# Patient Record
Sex: Female | Born: 1955 | Race: White | Hispanic: No | Marital: Married | Smoking: Never smoker
Health system: Southern US, Community
[De-identification: ages and names within clinical notes are randomized; demographics above are authoritative.]

## PROBLEM LIST (undated history)

## (undated) DIAGNOSIS — Z460 Encounter for fitting and adjustment of spectacles and contact lenses: Secondary | ICD-10-CM

## (undated) DIAGNOSIS — S83249A Other tear of medial meniscus, current injury, unspecified knee, initial encounter: Secondary | ICD-10-CM

## (undated) DIAGNOSIS — M65311 Trigger thumb, right thumb: Secondary | ICD-10-CM

## (undated) DIAGNOSIS — F419 Anxiety disorder, unspecified: Secondary | ICD-10-CM

## (undated) DIAGNOSIS — D219 Benign neoplasm of connective and other soft tissue, unspecified: Secondary | ICD-10-CM

## (undated) DIAGNOSIS — F32A Depression, unspecified: Secondary | ICD-10-CM

## (undated) DIAGNOSIS — R519 Headache, unspecified: Secondary | ICD-10-CM

## (undated) DIAGNOSIS — T4145XA Adverse effect of unspecified anesthetic, initial encounter: Secondary | ICD-10-CM

## (undated) DIAGNOSIS — F329 Major depressive disorder, single episode, unspecified: Secondary | ICD-10-CM

## (undated) DIAGNOSIS — R51 Headache: Secondary | ICD-10-CM

## (undated) DIAGNOSIS — T8859XA Other complications of anesthesia, initial encounter: Secondary | ICD-10-CM

## (undated) DIAGNOSIS — G5601 Carpal tunnel syndrome, right upper limb: Secondary | ICD-10-CM

## (undated) DIAGNOSIS — N809 Endometriosis, unspecified: Secondary | ICD-10-CM

## (undated) DIAGNOSIS — N390 Urinary tract infection, site not specified: Secondary | ICD-10-CM

## (undated) HISTORY — DX: Trigger thumb, right thumb: M65.311

## (undated) HISTORY — DX: Carpal tunnel syndrome, right upper limb: G56.01

## (undated) HISTORY — PX: OTHER SURGICAL HISTORY: SHX169

## (undated) HISTORY — DX: Urinary tract infection, site not specified: N39.0

## (undated) HISTORY — PX: SHOULDER SURGERY: SHX246

## (undated) HISTORY — PX: CARPAL TUNNEL RELEASE: SHX101

## (undated) HISTORY — DX: Benign neoplasm of connective and other soft tissue, unspecified: D21.9

## (undated) HISTORY — DX: Endometriosis, unspecified: N80.9

---

## 1991-01-25 HISTORY — PX: DILATION AND CURETTAGE OF UTERUS: SHX78

## 1991-01-25 HISTORY — PX: HYSTEROSCOPY: SHX211

## 1993-02-24 HISTORY — PX: APPENDECTOMY: SHX54

## 1993-06-25 HISTORY — PX: ABDOMINAL HYSTERECTOMY: SHX81

## 1998-07-30 ENCOUNTER — Other Ambulatory Visit: Admission: RE | Admit: 1998-07-30 | Discharge: 1998-07-30 | Payer: Self-pay | Admitting: Obstetrics and Gynecology

## 1999-07-02 ENCOUNTER — Ambulatory Visit (HOSPITAL_COMMUNITY): Admission: RE | Admit: 1999-07-02 | Discharge: 1999-07-02 | Payer: Self-pay | Admitting: Family Medicine

## 1999-07-02 ENCOUNTER — Encounter: Payer: Self-pay | Admitting: Family Medicine

## 1999-07-23 ENCOUNTER — Encounter: Payer: Self-pay | Admitting: Family Medicine

## 1999-07-23 ENCOUNTER — Encounter: Admission: RE | Admit: 1999-07-23 | Discharge: 1999-07-23 | Payer: Self-pay | Admitting: Family Medicine

## 1999-10-21 ENCOUNTER — Ambulatory Visit (HOSPITAL_COMMUNITY): Admission: RE | Admit: 1999-10-21 | Discharge: 1999-10-21 | Payer: Self-pay | Admitting: Neurosurgery

## 1999-10-21 ENCOUNTER — Encounter: Payer: Self-pay | Admitting: Neurosurgery

## 2000-10-15 ENCOUNTER — Ambulatory Visit (HOSPITAL_BASED_OUTPATIENT_CLINIC_OR_DEPARTMENT_OTHER): Admission: RE | Admit: 2000-10-15 | Discharge: 2000-10-15 | Payer: Self-pay | Admitting: Orthopedic Surgery

## 2000-10-15 HISTORY — PX: SHOULDER ARTHROSCOPY: SHX128

## 2002-02-04 ENCOUNTER — Encounter: Admission: RE | Admit: 2002-02-04 | Discharge: 2002-02-04 | Payer: Self-pay | Admitting: Internal Medicine

## 2002-02-04 ENCOUNTER — Encounter: Payer: Self-pay | Admitting: Obstetrics and Gynecology

## 2004-07-27 ENCOUNTER — Encounter: Admission: RE | Admit: 2004-07-27 | Discharge: 2004-07-27 | Payer: Self-pay | Admitting: Orthopedic Surgery

## 2005-06-25 HISTORY — PX: COLONOSCOPY W/ BIOPSIES: SHX1374

## 2007-03-05 ENCOUNTER — Encounter: Admission: RE | Admit: 2007-03-05 | Discharge: 2007-03-05 | Payer: Self-pay | Admitting: Obstetrics and Gynecology

## 2010-03-28 ENCOUNTER — Emergency Department (HOSPITAL_COMMUNITY)
Admission: EM | Admit: 2010-03-28 | Discharge: 2010-03-28 | Disposition: A | Discharge status: Home / Self Care | Payer: BC Managed Care – PPO | Attending: Emergency Medicine | Admitting: Emergency Medicine

## 2010-03-28 DIAGNOSIS — Z8739 Personal history of other diseases of the musculoskeletal system and connective tissue: Secondary | ICD-10-CM | POA: Insufficient documentation

## 2010-03-28 DIAGNOSIS — R079 Chest pain, unspecified: Secondary | ICD-10-CM | POA: Insufficient documentation

## 2010-03-28 DIAGNOSIS — R071 Chest pain on breathing: Secondary | ICD-10-CM | POA: Insufficient documentation

## 2010-05-27 ENCOUNTER — Other Ambulatory Visit: Payer: Self-pay | Admitting: Obstetrics and Gynecology

## 2010-05-27 DIAGNOSIS — Z1231 Encounter for screening mammogram for malignant neoplasm of breast: Secondary | ICD-10-CM

## 2010-06-10 ENCOUNTER — Ambulatory Visit: Payer: BC Managed Care – PPO

## 2010-06-11 ENCOUNTER — Ambulatory Visit
Admission: RE | Admit: 2010-06-11 | Discharge: 2010-06-11 | Disposition: A | Discharge status: Home / Self Care | Payer: BLUE CROSS/BLUE SHIELD | Source: Ambulatory Visit | Attending: Obstetrics and Gynecology | Admitting: Obstetrics and Gynecology

## 2010-06-11 DIAGNOSIS — Z1231 Encounter for screening mammogram for malignant neoplasm of breast: Secondary | ICD-10-CM

## 2010-07-12 NOTE — Op Note (Signed)
[facility]  Patient:    Megan Kidd, Megan Kidd Visit Number: [id number] MRN: [id number]          Service Type: DSU Location: [facility] Attending Physician:  Colbert Ewing Proc. Date: 10/15/00 Adm. 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  PROCEDURES: 1. Right shoulder examination and manipulation under anesthesia to obtain    motion. 2. Arthroscopy with lysis, debridement of intra-articular and subacromial    adhesions. 3. Acromioplasty with distal clavicle excision.  SURGEON:  Loreta Ave, M.D.  ASSISTANT:  Arlys John D. Petrarca, P.A.-C.  ANESTHESIA:  General.  ESTIMATED BLOOD LOSS:  Minimal.  SPECIMENS:  None.  CULTURES:  None.  COMPLICATIONS:  None.  DRESSING:  Soft compressive with sling.  DESCRIPTION OF PROCEDURE:  Patient brought to the operating room, placed on the operating table in supine position.  After adequate anesthesia had been obtained, right shoulder examined.  Motion at the start revealed 60 degrees of abduction, 90 degrees forward flexion, 60 degrees external rotation, internal rotation to 0 with the arm abducted.  The arm was sterilely manipulated with break-up of intra-articular and subacromial adhesions.  No adverse occurrence. Full motion achieved, maintaining stable shoulder.  Placed in a beach chair position on the shoulder positioner, prepped and draped in the usual sterile fashion.  Three standard arthroscopic portals, anterior, posterior, lateral. Shoulder entered with a blunt obturator, distended, and inspected. Intra-articular adhesions debrided along with synovitis.  Articular cartilage, rotator cuff, biceps tendon, biceps anchor,  labrum, and capsular ligamentous structures all otherwise normal.  Cannula redirected subacromially.  Thickened bursa with adhesions there as well all debrided.  Impingement with a type 2 acromion anteriorly, converted to a type 1 acromion with shaver and high-speed bur, releasing CA ligament.  Grade 3 chondromalacia AC joint from degenerative changes there.  Lateral centimeter sharply resected with shaver and high-speed bur.  Adequacy of decompression, distal clavicle excision, and lysis of adhesions confirmed viewing from all portals.  Instruments and fluid removed. Portals, shoulder, and bursa injected with Marcaine.  Portals closed with 4-0 nylon.  A sterile compressive dressing applied.  Anesthesia reversed, brought to recovery room.  Tolerated the surgery well with no complications. Attending Physician:  Colbert Ewing DD:  10/15/00 TD:  10/16/00 Job: [phone]

## 2011-04-25 HISTORY — PX: BUNIONECTOMY: SHX129

## 2011-06-26 ENCOUNTER — Other Ambulatory Visit: Payer: Self-pay | Admitting: Obstetrics and Gynecology

## 2011-06-30 ENCOUNTER — Other Ambulatory Visit: Payer: Self-pay | Admitting: Obstetrics and Gynecology

## 2011-06-30 DIAGNOSIS — M8580 Other specified disorders of bone density and structure, unspecified site: Secondary | ICD-10-CM

## 2011-06-30 DIAGNOSIS — Z1231 Encounter for screening mammogram for malignant neoplasm of breast: Secondary | ICD-10-CM

## 2011-07-11 ENCOUNTER — Ambulatory Visit: Payer: BLUE CROSS/BLUE SHIELD

## 2011-07-11 ENCOUNTER — Ambulatory Visit
Admission: RE | Admit: 2011-07-11 | Discharge: 2011-07-11 | Disposition: A | Discharge status: Home / Self Care | Payer: BC Managed Care – PPO | Source: Ambulatory Visit | Attending: Obstetrics and Gynecology | Admitting: Obstetrics and Gynecology

## 2011-07-11 DIAGNOSIS — M8580 Other specified disorders of bone density and structure, unspecified site: Secondary | ICD-10-CM

## 2011-07-11 DIAGNOSIS — Z1231 Encounter for screening mammogram for malignant neoplasm of breast: Secondary | ICD-10-CM

## 2012-08-23 ENCOUNTER — Encounter: Payer: Self-pay | Admitting: Obstetrics & Gynecology

## 2012-08-23 ENCOUNTER — Encounter: Payer: Self-pay | Admitting: Nurse Practitioner

## 2012-08-23 ENCOUNTER — Ambulatory Visit (INDEPENDENT_AMBULATORY_CARE_PROVIDER_SITE_OTHER): Payer: BC Managed Care – PPO | Admitting: Obstetrics & Gynecology

## 2012-08-23 VITALS — BP 128/80 | HR 60 | Temp 99.3°F | Resp 12 | Ht 62.25 in | Wt 145.6 lb

## 2012-08-23 DIAGNOSIS — N390 Urinary tract infection, site not specified: Secondary | ICD-10-CM

## 2012-08-23 LAB — POCT URINALYSIS DIPSTICK
Bilirubin, UA: NEGATIVE
Ketones, UA: NEGATIVE
Leukocytes, UA: NEGATIVE
pH, UA: 5

## 2012-08-23 MED ORDER — NITROFURANTOIN MONOHYD MACRO 100 MG PO CAPS: 100.0000 mg | ORAL_CAPSULE | Freq: Two times a day (BID) | ORAL | Status: DC

## 2012-08-23 NOTE — Progress Notes (Signed)
Subjective:     Patient ID: Megan Kidd, female   DOB: 05-07-1955, 57 y.o.   MRN: [id number]  Urinary Tract Infection  Associated symptoms include flank pain, hematuria and urgency.   57 year old MWF here with symptoms of bladder pressure and back pain since early Saturday morning.  Took three days of Levoquin--an antibiotic she had at home.  Reports this was Rx given to patient by Shirlyn Goltz.  Has noticed symptoms are mildly better but not gone.  Having some back pain.  Reports this started after intercourse on Thursday.  Going to [employer].  Going out of the July 17th--August 3rd.    Review of Systems  Constitutional:       Chills on Saturday morning  Genitourinary: Positive for urgency, hematuria, flank pain and dyspareunia.  All other systems reviewed and are negative.       Objective:   Physical Exam  Constitutional: She is oriented to person, place, and time. She appears well-developed and well-nourished.  Abdominal: Soft. Bowel sounds are normal. Distention: no suprapubic tenderness.  Genitourinary: Vagina normal and uterus normal.  Neurological: She is alert and oriented to person, place, and time.  Skin: Skin is warm and dry.  Psychiatric: She has a normal mood and affect.       Assessment:     UTI?, possible Levaquin resistance  H/O recurrent UTI's     Plan:     macrobid 100mg  bid x 7 days Urine culture

## 2012-08-23 NOTE — Patient Instructions (Signed)
Urinary Tract Infection  Urinary tract infections (UTIs) can develop anywhere along your urinary tract. Your urinary tract is your body's drainage system for removing wastes and extra water. Your urinary tract includes two kidneys, two ureters, a bladder, and a urethra. Your kidneys are a pair of bean-shaped organs. Each kidney is about the size of your fist. They are located below your ribs, one on each side of your spine.  CAUSES  Infections are caused by microbes, which are microscopic organisms, including fungi, viruses, and bacteria. These organisms are so small that they can only be seen through a microscope. Bacteria are the microbes that most commonly cause UTIs.  SYMPTOMS   Symptoms of UTIs may vary by age and gender of the patient and by the location of the infection. Symptoms in young women typically include a frequent and intense urge to urinate and a painful, burning feeling in the bladder or urethra during urination. Older women and men are more likely to be tired, shaky, and weak and have muscle aches and abdominal pain. A fever may mean the infection is in your kidneys. Other symptoms of a kidney infection include pain in your back or sides below the ribs, nausea, and vomiting.  DIAGNOSIS  To diagnose a UTI, your caregiver will ask you about your symptoms. Your caregiver also will ask to provide a urine sample. The urine sample will be tested for bacteria and white blood cells. White blood cells are made by your body to help fight infection.  TREATMENT   Typically, UTIs can be treated with medication. Because most UTIs are caused by a bacterial infection, they usually can be treated with the use of antibiotics. The choice of antibiotic and length of treatment depend on your symptoms and the type of bacteria causing your infection.  HOME CARE INSTRUCTIONS   If you were prescribed antibiotics, take them exactly as your caregiver instructs you. Finish the medication even if you feel better after you  have only taken some of the medication.   Drink enough water and fluids to keep your urine clear or pale yellow.   Avoid caffeine, tea, and carbonated beverages. They tend to irritate your bladder.   Empty your bladder often. Avoid holding urine for long periods of time.   Empty your bladder before and after sexual intercourse.   After a bowel movement, women should cleanse from front to back. Use each tissue only once.  SEEK MEDICAL CARE IF:    You have back pain.   You develop a fever.   Your symptoms do not begin to resolve within 3 days.  SEEK IMMEDIATE MEDICAL CARE IF:    You have severe back pain or lower abdominal pain.   You develop chills.   You have nausea or vomiting.   You have continued burning or discomfort with urination.  MAKE SURE YOU:    Understand these instructions.   Will watch your condition.   Will get help right away if you are not doing well or get worse.  Document Released: 11/20/2004 Document Revised: 08/12/2011 Document Reviewed: 03/21/2011  ExitCare Patient Information 2014 [employer].

## 2012-08-24 LAB — URINE CULTURE: Organism ID, Bacteria: NO GROWTH

## 2012-08-25 ENCOUNTER — Telehealth: Payer: Self-pay

## 2012-08-25 NOTE — Telephone Encounter (Signed)
Message copied by Elisha Headland on Wed Aug 25, 2012 11:18 AM ------      Message from: Jerene Bears      Created: Tue Aug 24, 2012  9:40 PM       Please call patient and inform culture is negative.  How are symptoms? ------

## 2012-08-25 NOTE — Telephone Encounter (Signed)
7/2 [id number]//[id number] 

## 2012-08-25 NOTE — Telephone Encounter (Signed)
Pt is returning Kelly's call

## 2012-08-25 NOTE — Telephone Encounter (Signed)
Patient notified of results.

## 2012-08-30 ENCOUNTER — Telehealth: Payer: Self-pay | Admitting: Obstetrics & Gynecology

## 2012-08-30 NOTE — Telephone Encounter (Signed)
Patient is still out of town and will be back on Thursday. Appointment given for Megan Kidd , FNP, July 10th @ 11:15am. Patient states she took all of macrobid with still some allergic reaction to but took for 7 days. States she still has pressure with urination and burning sensation.still with back pain. States she is going out of country on July 17th and needs to be well. Patient asked if you would call in antibiotic to where she is now till she can see you on Thursday. See note on June 30th  In EPIC. Reaction to Levoquini also.

## 2012-08-30 NOTE — Telephone Encounter (Signed)
Patient is not better after taking medication given by Dr.Miller. Patient would like to talk with a nurse.

## 2012-08-31 NOTE — Telephone Encounter (Signed)
She needs a urology appointment as well.

## 2012-08-31 NOTE — Telephone Encounter (Signed)
Follow up call to patient who complains of pain at onset of urine flow and bladder pressure.  She reports on-going history of UTI for which she had RX of Levaquin left over and self treated before seeing Dr Hyacinth Meeker 08-23-12.  She was then changed to Macrobid.  Urine culture results were negative.  Patient states symptoms have never resolved.  States medication for UTI symptoms did help initially but has taken any further.  Denies fever.  Does have back pain. Advised no futhrer antibiotics until we see her as culture was negative and antibiotics have not helped symptoms.  May be another issue.  Needs OV ASAP but patient is out of town.  Instructed to go to Urgent care if increased symptoms or fever.  Has OV with Patty on 09-02-12.  Will try to move to Dr Rondel Baton schedule. Agree with above?  Only avail appt with Dr Hyacinth Meeker at 145 on 09-02-12.

## 2012-09-02 ENCOUNTER — Ambulatory Visit (INDEPENDENT_AMBULATORY_CARE_PROVIDER_SITE_OTHER): Payer: BC Managed Care – PPO | Admitting: Nurse Practitioner

## 2012-09-02 ENCOUNTER — Encounter: Payer: Self-pay | Admitting: Nurse Practitioner

## 2012-09-02 VITALS — BP 118/70 | HR 74 | Temp 98.6°F | Resp 12 | Ht 62.5 in | Wt 148.8 lb

## 2012-09-02 DIAGNOSIS — N390 Urinary tract infection, site not specified: Secondary | ICD-10-CM

## 2012-09-02 LAB — POCT URINALYSIS DIPSTICK
Leukocytes, UA: NEGATIVE
pH, UA: 5

## 2012-09-02 MED ORDER — NITROFURANTOIN MONOHYD MACRO 100 MG PO CAPS: 100.0000 mg | ORAL_CAPSULE | Freq: Two times a day (BID) | ORAL | Status: DC

## 2012-09-02 MED ORDER — PHENAZOPYRIDINE HCL 200 MG PO TABS: 200.0000 mg | ORAL_TABLET | Freq: Three times a day (TID) | ORAL | Status: DC | PRN

## 2012-09-02 NOTE — Telephone Encounter (Signed)
Dr Hyacinth Meeker, I did not get this patient rescheduled onto your schedule and she saw Patty today and was retreated and new culture sent.  OV note from patty says may need referral to Urology but may need to wait until returns from trip.  Do you want me to still make urology appointment?

## 2012-09-02 NOTE — Telephone Encounter (Signed)
Go ahead and make urology referral.  Will be several weeks before she can be seen.  i think she has been treated with abx that she uses at will and this is making the clinical presentation much more complicated.  I think her culture will be negative.

## 2012-09-02 NOTE — Patient Instructions (Addendum)
Has info on UTI

## 2012-09-02 NOTE — Telephone Encounter (Signed)
Megan Kidd please schedule appointment for her.

## 2012-09-02 NOTE — Progress Notes (Signed)
Subjective:     Patient ID: Megan Kidd, female   DOB: Oct 13, 1955, 57 y.o.   MRN: [id number]  Pelvic Pain The patient's primary symptoms include pelvic pain. The patient's pertinent negatives include no vaginal discharge. Associated symptoms include dysuria, flank pain, frequency and urgency. Pertinent negatives include no abdominal pain, chills, constipation, diarrhea, fever, headaches, hematuria, nausea or vomiting.    50 MW Fe G2P2 with history of recurrent UTI.  She was seen on 08/24/12 with symptoms of bladder pressure, back pain, dysuria, flank pain and chills.  Because of multiple drug allergies she was restarted on Macrobid. This time without symptoms of rash.  She felt better but now symptoms have gradually increased.  She does take Pyridium prn.  She is very careful after Sexually activity to bathe, urinate, and all preventative measures.  She does consume about 4 - 20 oz bottle of H2O  during the day.  Usually nocturia X 2.  Recently denies fever or chills.  Some 'achy' sensation in the back.  She is getting ready to go on a mission trip to [place] next Thursday then going on to vacation after that. She is concerned about her symptoms while gone and flack of medical care. She does continue on Vaginal Estrogen.   Review of Systems  Constitutional: Negative for fever, chills and fatigue.       Not currently but did have symptoms of low grade fever/ chills at last episode.  Respiratory: Negative.   Cardiovascular: Negative.   Gastrointestinal: Negative.  Negative for nausea, vomiting, abdominal pain, diarrhea and constipation.  Genitourinary: Positive for dysuria, urgency, frequency, flank pain and pelvic pain. Negative for hematuria, vaginal bleeding, vaginal discharge, vaginal pain and dyspareunia.  Musculoskeletal: Negative.   Neurological: Negative.  Negative for weakness and headaches.  Psychiatric/Behavioral: Negative.  Negative for behavioral problems and decreased  concentration. The patient is not nervous/anxious.        Objective:   Physical Exam  Constitutional: She appears well-developed and well-nourished.  Pulmonary/Chest: Effort normal and breath sounds normal.  Abdominal: Soft. She exhibits no distension and no mass. There is tenderness. There is no rebound and no guarding.  supra pubic pressure to palpation  Genitourinary:  Normal vaginal discharge, no adnexal pain, some discomfort at the bladder.  Musculoskeletal: Normal range of motion.  Neurological: She is alert.  Psychiatric: She has a normal mood and affect. Her behavior is normal. Judgment and thought content normal.       Assessment:     R/O UTI vs cystitis History of multiple drug allergies including Macrobid but did OK recently without rash    Plan:     Urine culture sent to lab Refill Macrobid 100 mg BID to have on hand until urine culture returns RF pyridium 200 mg tid prn May need urology evaluation if symptoms continue - but may need to wait on return form [place] work and vacation

## 2012-09-03 NOTE — Telephone Encounter (Signed)
[facility] referral sent. Appointment given for August 11th@3 :00pm. With Dr. Kathie Rhodes. MacDiarmid. Patient notified of this.

## 2012-09-04 NOTE — Progress Notes (Signed)
Encounter reviewed by Dr. Brook Silva.  

## 2012-09-07 ENCOUNTER — Telehealth: Payer: Self-pay | Admitting: *Deleted

## 2012-09-07 NOTE — Telephone Encounter (Signed)
Message copied by Osie Bond on Tue Sep 07, 2012 11:01 AM ------      Message from: Ria Comment R      Created: Mon Sep 06, 2012  5:17 PM       Let patient know urine is negative ------

## 2012-09-07 NOTE — Telephone Encounter (Signed)
Pt is aware of negative urine culture results.  

## 2012-10-04 ENCOUNTER — Telehealth: Payer: Self-pay | Admitting: Nurse Practitioner

## 2012-10-04 NOTE — Telephone Encounter (Signed)
[facility] notified of problem of appt. Date and time of patient to see Dr. Sherron Monday. Receptionist was able to change appt. For Dr. Sherron Monday for patient to be seen on WED.August 13th @ arrive at 12:45pm and appt. @ 1:00pm. Patient notified of this.

## 2012-10-04 NOTE — Telephone Encounter (Signed)
Patient went to her appointment with [facility] at 3:30 arrived early at 3:20  And they counted her as a no show because her appointment was scheduled at 3:00 per them. Confused . Now she has been put with a brand new Dt. With their Practice. Cant see the Dr. She was Scheduled with.

## 2013-03-24 ENCOUNTER — Ambulatory Visit: Payer: Self-pay | Admitting: Nurse Practitioner

## 2013-04-05 ENCOUNTER — Ambulatory Visit: Payer: Self-pay | Admitting: Nurse Practitioner

## 2013-04-21 ENCOUNTER — Ambulatory Visit: Payer: BC Managed Care – PPO | Admitting: Nurse Practitioner

## 2013-05-05 ENCOUNTER — Ambulatory Visit: Payer: BC Managed Care – PPO | Admitting: Nurse Practitioner

## 2013-06-07 ENCOUNTER — Telehealth: Payer: Self-pay | Admitting: Nurse Practitioner

## 2013-06-07 MED ORDER — ESTRADIOL 1 MG PO TABS: 1.0000 mg | ORAL_TABLET | Freq: Every day | ORAL | Status: DC

## 2013-06-07 NOTE — Telephone Encounter (Signed)
Last AEX and refill 02/2012 45mos/3 refills Next appt 06/13/2013   Will refill once until her appt.  Patient notified.

## 2013-06-07 NOTE — Telephone Encounter (Signed)
Patient is requesting a refill of Estradiol until her appointment Monday with [address]. Harris Tetter @ [facility] Patient needs refill by tomorrow afternoon.

## 2013-06-13 ENCOUNTER — Ambulatory Visit (INDEPENDENT_AMBULATORY_CARE_PROVIDER_SITE_OTHER): Payer: BC Managed Care – PPO | Admitting: Nurse Practitioner

## 2013-06-13 ENCOUNTER — Encounter: Payer: Self-pay | Admitting: Nurse Practitioner

## 2013-06-13 VITALS — BP 90/62 | HR 60 | Ht 62.25 in | Wt 146.0 lb

## 2013-06-13 DIAGNOSIS — Z01419 Encounter for gynecological examination (general) (routine) without abnormal findings: Secondary | ICD-10-CM

## 2013-06-13 DIAGNOSIS — Z0000 Encounter for general adult medical examination without abnormal findings: Secondary | ICD-10-CM

## 2013-06-13 LAB — POCT URINALYSIS DIPSTICK
BILIRUBIN UA: NEGATIVE
Blood, UA: NEGATIVE
Glucose, UA: NEGATIVE
KETONES UA: NEGATIVE
LEUKOCYTES UA: NEGATIVE
Nitrite, UA: NEGATIVE
Protein, UA: NEGATIVE
Urobilinogen, UA: NEGATIVE
pH, UA: 5

## 2013-06-13 MED ORDER — ESTRADIOL 0.1 MG/GM VA CREA: TOPICAL_CREAM | VAGINAL | Status: DC

## 2013-06-13 MED ORDER — NITROFURANTOIN MONOHYD MACRO 100 MG PO CAPS: 100.0000 mg | ORAL_CAPSULE | ORAL | Status: DC | PRN

## 2013-06-13 MED ORDER — ESTRADIOL 10 MCG VA TABS: 1.0000 | ORAL_TABLET | VAGINAL | Status: DC

## 2013-06-13 MED ORDER — ESTRADIOL 1 MG PO TABS: ORAL_TABLET | ORAL | Status: DC

## 2013-06-13 NOTE — Patient Instructions (Addendum)

## 2013-06-13 NOTE — Progress Notes (Signed)
Patient ID: Megan Kidd, female   DOB: 1955-12-25, 58 y.o.   MRN: [id number] 58 y.o. [id number] Married Caucasian Fe here for annual exam.  She feels well.  Less incidence of UTI with using Estrace at the urethra 1- 2 X week. 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  Patient's last menstrual period was 06/25/1993.          Sexually active: yes  The current method of family planning is status post hysterectomy.    Exercising: no  The patient does not participate in regular exercise at present. Smoker:  no  Health Maintenance: Pap:  07/30/98, WNL (TAH) MMG:  07/11/11, Bi-Rads 1: negative - scheduled Colonoscopy:  07/14/05, colitis, repeat in 10 years BMD:  07/11/11, 0.2/-1.5 TDaP:  UTD Shingles: 02/2013 Labs:  HB:  13.8 Urine:  negative   reports that she has never smoked. She has never used smokeless tobacco. She reports that she drinks alcohol. She reports that she does not use illicit drugs.  Past Medical History  Diagnosis Date  . Recurrent UTI   . Fibroids   . Endometriosis     Past Surgical History  Procedure Laterality Date  . Hysteroscopy  12/92  . Dilation and curettage of uterus  12/92    Hysteroscopy  . Bunionectomy Left 04/2011  . Cesarean section      x2  . Shoulder surgery      2202-2005 bilateral frozen shoulders  . Torn tendon      right elbow  . Appendectomy  1995  . Abdominal hysterectomy  06/25/93    secondary to endometriosis  . Colonoscopy w/ biopsies  06/25/2005    colitis - recheck in 10 years    Current Outpatient Prescriptions  Medication Sig Dispense Refill  . Ascorbic Acid (VITAMIN C PO) Take by mouth.      . B Complex Vitamins (B COMPLEX PO) Take by mouth.      . Biotin 10 MG TABS Take 1 tablet by mouth daily.      Marland Kitchen buPROPion (WELLBUTRIN XL) 150 MG 24 hr tablet Take 150 mg by mouth daily.      . cholecalciferol (VITAMIN D) 1000 UNITS tablet Take 1,000 Units by mouth daily.      Marland Kitchen CRANBERRY PO Take by  mouth.      . estradiol (ESTRACE) 1 MG tablet 1/2 tablet daily  90 tablet  3  . Estradiol (VAGIFEM) 10 MCG TABS vaginal tablet Place 1 tablet (10 mcg total) vaginally 2 (two) times a week.  24 tablet  3  . MAGNESIUM PO Take by mouth.      . meloxicam (MOBIC) 15 MG tablet Take 1 tablet by mouth daily.      . nitrofurantoin, macrocrystal-monohydrate, (MACROBID) 100 MG capsule Take 1 capsule (100 mg total) by mouth as needed (after intercourse).  30 capsule  3  . Omega-3 Fatty Acids (FISH OIL PO) Take by mouth.      . Probiotic Product (PROBIOTIC PO) Take by mouth.      . Vilazodone HCl (VIIBRYD) 40 MG TABS Take 40 mg by mouth daily.       Marland Kitchen aspirin 81 MG tablet Take 81 mg by mouth daily.      Marland Kitchen estradiol (ESTRACE) 0.1 MG/GM vaginal cream Use pea size amount at urethra prn  42.5 g  3   No current facility-administered medications for this visit.    Family History  Problem Relation Age of Onset  . Hypertension Mother   . Heart failure Father   . Hypertension Father     ROS:  Pertinent items are noted in HPI.  Otherwise, a comprehensive ROS was negative.  Exam:   BP 90/62  Pulse 60  Ht 5' 2.25" (1.581 m)  Wt 146 lb (66.225 kg)  BMI 26.49 kg/m2  LMP 06/25/1993 Height: 5' 2.25" (158.1 cm)  Ht Readings from Last 3 Encounters:  06/13/13 5' 2.25" (1.581 m)  09/02/12 5' 2.5" (1.588 m)  08/23/12 5' 2.25" (1.581 m)    General appearance: alert, cooperative and appears stated age Head: Normocephalic, without obvious abnormality, atraumatic Neck: no adenopathy, supple, symmetrical, trachea midline and thyroid normal to inspection and palpation Lungs: clear to auscultation bilaterally Breasts: normal appearance, no masses or tenderness Heart: regular rate and rhythm Abdomen: soft, non-tender; no masses,  no organomegaly Extremities: extremities normal, atraumatic, no cyanosis or edema Skin: Skin color, texture, turgor normal. No rashes or lesions Lymph nodes: Cervical, supraclavicular,  and axillary nodes normal. No abnormal inguinal nodes palpated Neurologic: Grossly normal   Pelvic: External genitalia:  no lesions              Urethra:  normal appearing urethra with no masses, tenderness or lesions              Bartholin's and Skene's: normal                 Vagina: improved atrophic changes appearing vagina with pale color and discharge, no lesions              Cervix: absent              Pap taken: no Bimanual Exam:  Uterus:  uterus absent              Adnexa: no mass, fullness, tenderness               Rectovaginal: Confirms               Anus:  normal sphincter tone, no lesions  A:  Well Woman with normal exam  S/P TAH 5/95 secondary to endometriosis on ERT  Atrophic vaginitis and UTI improved on vaginal E  P:   Pap smear as per guidelines   Mammogram is scheduled  Refill Vagifem and Estrace vaginal cream to use at the urethra  Refill Estradiol 0.5 mg daily for a year  Counseled  with risk CVA, DVT, cancer, etc counseled on breast self exam, mammography screening, adequate intake of calcium and vitamin D, diet and exercise return annually or prn  An After Visit Summary was printed and given to the patient.

## 2013-06-14 ENCOUNTER — Encounter: Payer: Self-pay | Admitting: Nurse Practitioner

## 2013-06-16 ENCOUNTER — Other Ambulatory Visit: Payer: Self-pay

## 2013-06-16 DIAGNOSIS — Z1231 Encounter for screening mammogram for malignant neoplasm of breast: Secondary | ICD-10-CM

## 2013-06-18 NOTE — Progress Notes (Signed)
Encounter reviewed by Dr. Arnella Pralle Silva.  

## 2013-06-22 ENCOUNTER — Encounter (HOSPITAL_BASED_OUTPATIENT_CLINIC_OR_DEPARTMENT_OTHER): Payer: Self-pay | Admitting: *Deleted

## 2013-06-23 ENCOUNTER — Encounter (HOSPITAL_BASED_OUTPATIENT_CLINIC_OR_DEPARTMENT_OTHER): Payer: Self-pay | Admitting: *Deleted

## 2013-06-23 ENCOUNTER — Ambulatory Visit: Payer: BC Managed Care – PPO

## 2013-06-23 NOTE — Progress Notes (Signed)
Pt going out of town may 4-13-no labs needed

## 2013-06-24 ENCOUNTER — Ambulatory Visit
Admission: RE | Admit: 2013-06-24 | Discharge: 2013-06-24 | Disposition: A | Discharge status: Home / Self Care | Payer: BC Managed Care – PPO | Source: Ambulatory Visit

## 2013-06-24 DIAGNOSIS — Z1231 Encounter for screening mammogram for malignant neoplasm of breast: Secondary | ICD-10-CM

## 2013-06-28 ENCOUNTER — Other Ambulatory Visit: Payer: Self-pay | Admitting: Orthopedic Surgery

## 2013-07-04 ENCOUNTER — Encounter (HOSPITAL_BASED_OUTPATIENT_CLINIC_OR_DEPARTMENT_OTHER): Payer: Self-pay | Admitting: *Deleted

## 2013-07-06 NOTE — H&P (Signed)
Megan Kidd is an 58 y.o. female.   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 She has had a more than 18 month history of bilateral hand numbness, right worse than left.  She has no antecedent history of injury. She has no history of significant cervical degenerative disc disease.  She does have a complex past orthopaedic history.  She has had bilateral frozen shoulders. She has had scope, debridement of her shoulders bilaterally, three manipulations of the right shoulder, two manipulations of the left shoulder.   She has been wearing night splints since January on her wrist without relief of her numbness.  She is now referred for an upper extremity orthopaedic consult.  She has had no symptoms of stenosing tenosynovitis.     Past Medical History  Diagnosis Date  . Recurrent UTI   . Fibroids   . Endometriosis   . Contact lens/glasses fitting     wears contacts or glasses  . Depression   . Anxiety     Past Surgical History  Procedure Laterality Date  . Hysteroscopy  12/92  . Dilation and curettage of uterus  12/92    Hysteroscopy  . Bunionectomy Left 04/2011  . Cesarean section      x2  . Shoulder surgery      2202-2005 bilateral frozen shoulders  . Torn tendon      right elbow  . Appendectomy  1995  . Abdominal hysterectomy  06/25/93    secondary to endometriosis  . Colonoscopy w/ biopsies  06/25/2005    colitis - recheck in 10 years  . Shoulder arthroscopy Right 10/15/2000    with debridement, DCE and manipulation    Family History  Problem Relation Age of Onset  . Hypertension Mother   . Heart failure Father   . Hypertension Father    Social History:  reports that she has never smoked. She has never used smokeless tobacco. She reports that she drinks alcohol. She reports that she does not use illicit drugs.  Allergies:  Allergies  Allergen Reactions  . Ampicillin  Rash  . Ciprofloxacin Rash  . Levaquin [Levofloxacin In D5w] Rash    Rash around chest and neck and under breast  . Macrobid [Nitrofurantoin] Rash  . Penicillins Rash  . Percocet [Oxycodone-Acetaminophen] Rash  . Septra [Sulfamethoxazole-Tmp Ds] Rash  . Zithromax [Azithromycin] Rash    No prescriptions prior to admission    No results found for this or any previous visit (from the past 48 hour(s)).  No results found.   Pertinent items are noted in HPI.  Height 5\' 2"  (1.575 m), weight 65.772 kg (145 lb), last menstrual period 06/25/1993.  General appearance: alert Head: Normocephalic, without obvious abnormality Neck: supple, symmetrical, trachea midline Resp: clear to auscultation bilaterally Cardio: regular rate and rhythm GI: normal findings: bowel sounds normal Extremities:  Inspection of her arms and hands reveals no thenar atrophy or deformity.  She has full active range of motion of her fingers in flexion extension, no sign of stenosing tenosynovitis of her fingers, thumb or first dorsal compartments.  Her pulse and cap refill are intact.  Her wrist flexion test is positive reproducing median numbness bilaterally.  She has positive Tinel's sign over her median nerves at the wrist bilaterally. She has no proximal symptoms.    We asked Dr. Zebedee Iba to perform detailed electrodiagnostic studies. 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  Her sensory amplitude is significantly diminished at 25 microvolts on the right, 68.3 microvolts on the left.    Pulses: 2+ and symmetric  Skin: normal Neurologic: Grossly normal    Assessment/Plan Impression:  Right CTS  Plan: To the OR for right CTR.The procedure, risks,benefits and post-op course were discussed with the patient at length and they were in agreement with the plan.  Marily Lente Dasnoit 07/06/2013, 2:27 PM  H&P documentation: 07/07/2013  -History and Physical Reviewed  -Patient has  been re-examined  -No change in the plan of care  Cammie Sickle, MD

## 2013-07-07 ENCOUNTER — Ambulatory Visit (HOSPITAL_BASED_OUTPATIENT_CLINIC_OR_DEPARTMENT_OTHER): Payer: BC Managed Care – PPO | Admitting: Anesthesiology

## 2013-07-07 ENCOUNTER — Encounter (HOSPITAL_BASED_OUTPATIENT_CLINIC_OR_DEPARTMENT_OTHER)
Admission: RE | Disposition: A | Discharge status: Home / Self Care | Payer: Self-pay | Source: Ambulatory Visit | Attending: Orthopedic Surgery

## 2013-07-07 ENCOUNTER — Encounter (HOSPITAL_BASED_OUTPATIENT_CLINIC_OR_DEPARTMENT_OTHER): Payer: BC Managed Care – PPO | Admitting: Anesthesiology

## 2013-07-07 ENCOUNTER — Encounter (HOSPITAL_BASED_OUTPATIENT_CLINIC_OR_DEPARTMENT_OTHER): Payer: Self-pay | Admitting: *Deleted

## 2013-07-07 ENCOUNTER — Ambulatory Visit (HOSPITAL_BASED_OUTPATIENT_CLINIC_OR_DEPARTMENT_OTHER)
Admission: RE | Admit: 2013-07-07 | Discharge: 2013-07-07 | Disposition: A | Discharge status: Home / Self Care | Payer: BC Managed Care – PPO | Source: Ambulatory Visit | Attending: Orthopedic Surgery | Admitting: Orthopedic Surgery

## 2013-07-07 DIAGNOSIS — G5600 Carpal tunnel syndrome, unspecified upper limb: Secondary | ICD-10-CM | POA: Insufficient documentation

## 2013-07-07 DIAGNOSIS — F411 Generalized anxiety disorder: Secondary | ICD-10-CM | POA: Insufficient documentation

## 2013-07-07 DIAGNOSIS — F329 Major depressive disorder, single episode, unspecified: Secondary | ICD-10-CM | POA: Insufficient documentation

## 2013-07-07 DIAGNOSIS — Z882 Allergy status to sulfonamides status: Secondary | ICD-10-CM | POA: Insufficient documentation

## 2013-07-07 DIAGNOSIS — Z881 Allergy status to other antibiotic agents status: Secondary | ICD-10-CM | POA: Insufficient documentation

## 2013-07-07 DIAGNOSIS — F3289 Other specified depressive episodes: Secondary | ICD-10-CM | POA: Insufficient documentation

## 2013-07-07 DIAGNOSIS — Z880 Allergy status to penicillin: Secondary | ICD-10-CM | POA: Insufficient documentation

## 2013-07-07 HISTORY — DX: Depression, unspecified: F32.A

## 2013-07-07 HISTORY — DX: Encounter for fitting and adjustment of spectacles and contact lenses: Z46.0

## 2013-07-07 HISTORY — DX: Other complications of anesthesia, initial encounter: T88.59XA

## 2013-07-07 HISTORY — DX: Anxiety disorder, unspecified: F41.9

## 2013-07-07 HISTORY — DX: Major depressive disorder, single episode, unspecified: F32.9

## 2013-07-07 HISTORY — PX: CARPAL TUNNEL RELEASE: SHX101

## 2013-07-07 HISTORY — DX: Adverse effect of unspecified anesthetic, initial encounter: T41.45XA

## 2013-07-07 SURGERY — CARPAL TUNNEL RELEASE
Anesthesia: Monitor Anesthesia Care | Site: Hand | Laterality: Right

## 2013-07-07 MED ORDER — LACTATED RINGERS IV SOLN
INTRAVENOUS | Status: DC
  Administered 2013-07-07: 07:00:00 via INTRAVENOUS

## 2013-07-07 MED ORDER — FENTANYL CITRATE 0.05 MG/ML IJ SOLN
INTRAMUSCULAR | Status: DC | PRN
  Administered 2013-07-07: 100 ug via INTRAVENOUS

## 2013-07-07 MED ORDER — MEPERIDINE HCL 25 MG/ML IJ SOLN: 6.2500 mg | INTRAMUSCULAR | Status: DC | PRN

## 2013-07-07 MED ORDER — FENTANYL CITRATE 0.05 MG/ML IJ SOLN
INTRAMUSCULAR | Status: AC
  Filled 2013-07-07: qty 4

## 2013-07-07 MED ORDER — MIDAZOLAM HCL 2 MG/2ML IJ SOLN
INTRAMUSCULAR | Status: AC
  Filled 2013-07-07: qty 2

## 2013-07-07 MED ORDER — MIDAZOLAM HCL 2 MG/2ML IJ SOLN: 1.0000 mg | INTRAMUSCULAR | Status: DC | PRN

## 2013-07-07 MED ORDER — MIDAZOLAM HCL 5 MG/5ML IJ SOLN
INTRAMUSCULAR | Status: DC | PRN
  Administered 2013-07-07: 2 mg via INTRAVENOUS

## 2013-07-07 MED ORDER — CHLORHEXIDINE GLUCONATE 4 % EX LIQD: 60.0000 mL | Freq: Once | CUTANEOUS | Status: DC

## 2013-07-07 MED ORDER — HYDROMORPHONE HCL PF 1 MG/ML IJ SOLN: 0.2500 mg | INTRAMUSCULAR | Status: DC | PRN

## 2013-07-07 MED ORDER — FENTANYL CITRATE 0.05 MG/ML IJ SOLN: 50.0000 ug | INTRAMUSCULAR | Status: DC | PRN

## 2013-07-07 MED ORDER — PROPOFOL 10 MG/ML IV BOLUS
INTRAVENOUS | Status: DC | PRN
  Administered 2013-07-07: 50 mg via INTRAVENOUS

## 2013-07-07 MED ORDER — PROPOFOL INFUSION 10 MG/ML OPTIME
INTRAVENOUS | Status: DC | PRN
  Administered 2013-07-07: 100 ug/kg/min via INTRAVENOUS

## 2013-07-07 MED ORDER — LIDOCAINE HCL 2 % IJ SOLN
INTRAMUSCULAR | Status: AC
  Filled 2013-07-07: qty 60

## 2013-07-07 MED ORDER — OXYCODONE HCL 5 MG/5ML PO SOLN: 5.0000 mg | Freq: Once | ORAL | Status: DC | PRN

## 2013-07-07 MED ORDER — OXYCODONE HCL 5 MG PO TABS: 5.0000 mg | ORAL_TABLET | Freq: Once | ORAL | Status: DC | PRN

## 2013-07-07 MED ORDER — ONDANSETRON HCL 4 MG/2ML IJ SOLN: 4.0000 mg | Freq: Once | INTRAMUSCULAR | Status: DC | PRN

## 2013-07-07 MED ORDER — LIDOCAINE HCL 2 % IJ SOLN
INTRAMUSCULAR | Status: DC | PRN
  Administered 2013-07-07: 5 mL

## 2013-07-07 SURGICAL SUPPLY — 42 items
BANDAGE ADH SHEER 1  50/CT (GAUZE/BANDAGES/DRESSINGS) IMPLANT
BANDAGE ELASTIC 3 VELCRO ST LF (GAUZE/BANDAGES/DRESSINGS) IMPLANT
BLADE 15 SAFETY STRL DISP (BLADE) ×2 IMPLANT
BLADE SURG 15 STRL LF DISP TIS (BLADE) ×1 IMPLANT
BLADE SURG 15 STRL SS (BLADE) ×1
BNDG COHESIVE 3X5 TAN STRL LF (GAUZE/BANDAGES/DRESSINGS) ×2 IMPLANT
BNDG ESMARK 4X9 LF (GAUZE/BANDAGES/DRESSINGS) ×2 IMPLANT
BRUSH SCRUB EZ PLAIN DRY (MISCELLANEOUS) ×2 IMPLANT
CORDS BIPOLAR (ELECTRODE) ×2 IMPLANT
COVER MAYO STAND STRL (DRAPES) ×2 IMPLANT
COVER TABLE BACK 60X90 (DRAPES) ×2 IMPLANT
CUFF TOURNIQUET SINGLE 18IN (TOURNIQUET CUFF) ×2 IMPLANT
DECANTER SPIKE VIAL GLASS SM (MISCELLANEOUS) IMPLANT
DRAPE EXTREMITY T 121X128X90 (DRAPE) ×2 IMPLANT
DRAPE SURG 17X23 STRL (DRAPES) ×2 IMPLANT
GAUZE SPONGE 4X4 12PLY STRL (GAUZE/BANDAGES/DRESSINGS) IMPLANT
GLOVE BIO SURGEON STRL SZ 6.5 (GLOVE) ×2 IMPLANT
GLOVE BIOGEL M STRL SZ7.5 (GLOVE) IMPLANT
GLOVE BIOGEL PI IND STRL 7.0 (GLOVE) ×1 IMPLANT
GLOVE BIOGEL PI INDICATOR 7.0 (GLOVE) ×1
GLOVE ECLIPSE 6.5 STRL STRAW (GLOVE) ×2 IMPLANT
GLOVE ORTHO TXT STRL SZ7.5 (GLOVE) ×2 IMPLANT
GOWN STRL REUS W/ TWL LRG LVL3 (GOWN DISPOSABLE) ×1 IMPLANT
GOWN STRL REUS W/ TWL XL LVL3 (GOWN DISPOSABLE) ×2 IMPLANT
GOWN STRL REUS W/TWL LRG LVL3 (GOWN DISPOSABLE) ×1
GOWN STRL REUS W/TWL XL LVL3 (GOWN DISPOSABLE) ×2
NEEDLE 27GAX1X1/2 (NEEDLE) ×2 IMPLANT
PACK BASIN DAY SURGERY FS (CUSTOM PROCEDURE TRAY) ×2 IMPLANT
PAD CAST 3X4 CTTN HI CHSV (CAST SUPPLIES) ×1 IMPLANT
PADDING CAST ABS 4INX4YD NS (CAST SUPPLIES) ×1
PADDING CAST ABS COTTON 4X4 ST (CAST SUPPLIES) ×1 IMPLANT
PADDING CAST COTTON 3X4 STRL (CAST SUPPLIES) ×1
SPLINT PLASTER CAST XFAST 3X15 (CAST SUPPLIES) ×3 IMPLANT
SPLINT PLASTER XTRA FASTSET 3X (CAST SUPPLIES) ×3
STOCKINETTE 4X48 STRL (DRAPES) ×2 IMPLANT
STRIP CLOSURE SKIN 1/2X4 (GAUZE/BANDAGES/DRESSINGS) ×2 IMPLANT
SUT PROLENE 3 0 PS 2 (SUTURE) ×2 IMPLANT
SYR 3ML 23GX1 SAFETY (SYRINGE) IMPLANT
SYR CONTROL 10ML LL (SYRINGE) ×2 IMPLANT
TOWEL OR 17X24 6PK STRL BLUE (TOWEL DISPOSABLE) ×2 IMPLANT
TRAY DSU PREP LF (CUSTOM PROCEDURE TRAY) ×2 IMPLANT
UNDERPAD 30X30 INCONTINENT (UNDERPADS AND DIAPERS) ×2 IMPLANT

## 2013-07-07 NOTE — Op Note (Signed)
[id number] 

## 2013-07-07 NOTE — Anesthesia Postprocedure Evaluation (Signed)
Anesthesia Post Note  Patient: Megan Kidd  Procedure(s) Performed: Procedure(s) (LRB): RIGHT CARPAL TUNNEL RELEASE (Right)  Anesthesia type: general  Patient location: PACU  Post pain: Pain level controlled  Post assessment: Patient's Cardiovascular Status Stable  Last Vitals:  Filed Vitals:   07/07/13 0945  BP: 121/72  Pulse: 46  Temp: 36.5 C  Resp: 14    Post vital signs: Reviewed and stable  Level of consciousness: sedated  Complications: No apparent anesthesia complications

## 2013-07-07 NOTE — Anesthesia Preprocedure Evaluation (Addendum)
Anesthesia Evaluation  Patient identified by MRN, date of birth, ID band Patient awake    Reviewed: Allergy & Precautions, H&P , NPO status , Patient's Chart, lab work & pertinent test results  Airway       Dental   Pulmonary neg pulmonary ROS,          Cardiovascular negative cardio ROS      Neuro/Psych Anxiety Depression negative neurological ROS     GI/Hepatic   Endo/Other    Renal/GU      Musculoskeletal   Abdominal   Peds  Hematology   Anesthesia Other Findings   Reproductive/Obstetrics                           Anesthesia Physical Anesthesia Plan Anesthesia Quick Evaluation

## 2013-07-07 NOTE — Brief Op Note (Signed)
07/07/2013  8:18 AM  PATIENT:  Megan Kidd  58 y.o. female  PRE-OPERATIVE DIAGNOSIS:  RIGHT CARPAL TUNNEL SYNDROME  POST-OPERATIVE DIAGNOSIS:  right carpal tunnel syndrome  PROCEDURE:  Procedure(s): RIGHT CARPAL TUNNEL RELEASE (Right)  SURGEON:  Surgeon(s) and Role:    * Cammie Sickle., MD - Primary  PHYSICIAN ASSISTANT:   ASSISTANTS: nurse   ANESTHESIA:   MAC  EBL:  Total I/O In: 100 [I.V.:100] Out: -   BLOOD ADMINISTERED:none  DRAINS: none   LOCAL MEDICATIONS USED:  LIDOCAINE   SPECIMEN:  No Specimen  DISPOSITION OF SPECIMEN:  N/A  COUNTS:  YES  TOURNIQUET:   Total Tourniquet Time Documented: Upper Arm (Right) - 16 minutes Total: Upper Arm (Right) - 16 minutes   DICTATION: .Other Dictation: Dictation Number [id number]  PLAN OF CARE: Discharge to home after PACU  PATIENT DISPOSITION:  PACU - hemodynamically stable.   Delay start of Pharmacological VTE agent (>24hrs) due to surgical blood loss or risk of bleeding: not applicable

## 2013-07-07 NOTE — Transfer of Care (Signed)
Immediate Anesthesia Transfer of Care Note  Patient: Megan Kidd  Procedure(s) Performed: Procedure(s): RIGHT CARPAL TUNNEL RELEASE (Right)  Patient Location: PACU  Anesthesia Type:MAC  Level of Consciousness: awake, alert  and oriented  Airway & Oxygen Therapy: Patient Spontanous Breathing and Patient connected to face mask oxygen  Post-op Assessment: Report given to PACU RN and Post -op Vital signs reviewed and stable  Post vital signs: Reviewed and stable  Complications: No apparent anesthesia complications

## 2013-07-07 NOTE — Discharge Instructions (Addendum)
[facility] Instructions Hand Surgery  Wound Care: Keep your hand elevated above the level of your heart.  Do not allow it to dangle by your side.  Keep the dressing dry and do not remove it unless your doctor advises you to do so.  He will usually change it at the time of your post-op visit.  Moving your fingers is advised to stimulate circulation but will depend on the site of your surgery.  If you have a splint applied, your doctor will advise you regarding movement.  Activity: Do not drive or operate machinery today.  Rest today and then you may return to your normal activity and work as indicated by your physician.  Diet:  Drink liquids today or eat a light diet.  You may resume a regular diet tomorrow.    General expectations: Pain for two to three days. Fingers may become slightly swollen.  Call your doctor if any of the following occur: Severe pain not relieved by pain medication. Elevated temperature. Dressing soaked with blood. Inability to move fingers. White or bluish color to fingers.  Use tylenol and / or Aleve as first line medication for post operative pain  Use the hydrocodone for more difficult pain but do not use additional tylenol while taking the hydrocodone medication   Post Anesthesia Home Care Instructions  Activity: Get plenty of rest for the remainder of the day. A responsible adult should stay with you for 24 hours following the procedure.  For the next 24 hours, DO NOT: -Drive a car -Paediatric nurse -Drink alcoholic beverages -Take any medication unless instructed by your physician -Make any legal decisions or sign important papers.  Meals: Start with liquid foods such as gelatin or soup. Progress to regular foods as tolerated. Avoid greasy, spicy, heavy foods. If nausea and/or vomiting occur, drink only clear liquids until the nausea and/or vomiting subsides. Call your physician if vomiting continues.  Special Instructions/Symptoms: Your  throat may feel dry or sore from the anesthesia or the breathing tube placed in your throat during surgery. If this causes discomfort, gargle with warm salt water. The discomfort should disappear within 24 hours.

## 2013-07-08 NOTE — Op Note (Signed)
Megan Kidd.:  [id number]  MEDICAL RECORD NO.:  [id number]  LOCATION:                                 FACILITY:  PHYSICIAN:  Megan Kidd, M.D.      DATE OF BIRTH:  DATE OF PROCEDURE:  07/07/2013 DATE OF DISCHARGE:                              OPERATIVE REPORT   PREOPERATIVE DIAGNOSIS:  Entrapment neuropathy, median nerve, right carpal tunnel.  POSTOPERATIVE DIAGNOSIS:  Entrapment neuropathy, median nerve, right carpal tunnel.  OPERATION:  Release of right transverse carpal ligament.  OPERATING SURGEON:  Megan Mighty. Megan Rogue, MD.  ASSISTANT:  Nurse.  ANESTHESIA:  2% lidocaine, palmar block and median nerve block in distal forearm supplemented by IV sedation, i.e. monitored anesthesia care.  SUPERVISING ANESTHESIOLOGIST:  Dr. Conrad Kidd.  INDICATIONS:  Megan Kidd is a 58 year old woman referred through the courtesy of Dr. Maury Kidd, primary care physician for management of bilateral hand numbness.  Clinical examination in the office revealed evidence of probable bilateral carpal tunnel syndrome. Electrodiagnostic studies completed by Dr. Zebedee Kidd confirmed bilateral carpal tunnel syndrome right worse than left.  We had a detailed informed consent with Megan Kidd.  Originally scheduled her for release of her right transverse carpal ligament under general anesthesia.  Megan Kidd re-contacted the office and requested that her anesthesia be changed to sedation and local anesthesia.  This is perfectly acceptable. The application of this choice, however, typically it is a bit more challenging to see in the surgical wound with local anesthesia infiltrated in place, therefore her incision will need to be somewhat larger and at times more extensive retractions required to visualize the median nerve safely with distended tissues in the subcutaneous space.  She was interviewed in the holding area and clinical examination completed.  Questions  were invited and answered in detail with Megan Kidd and her husband.  Her right hand and arm were marked as a proper surgical site per protocol with a marking pen, followed by anesthesia, informed consent by Dr. Conrad Kidd.  Local anesthesia and sedation was recommended and accepted.  Questions were invited and answered in detail.  She is transferred to room 1 of the Megan Kidd and placed supine position on the operating table.  Under Dr. Everlene Kidd supervision, IV sedation was provided.  After Betadine prep to the distal forearm and hand, 5 mL of 2% lidocaine were infiltrated around the median nerve in the distal forearm and in the area of the intended incision.  The right hand and arm were then prepped with Betadine soap and solution, sterilely draped.  A pneumatic tourniquet was applied to the proximal right brachium.  Following exsanguination of right arm with Esmarch bandage, arterial tourniquet was inflated to 220 mmHg.  Following routine surgical time-out, procedure commenced with an initial 2 cm incision.  Subcutaneous tissues were carefully divided revealing a remarkable amount of adipose tissue subcutaneously.  The palmar fascia was identified and incised longitudinally.  The common sensory branch of the median nerve and flexor tendons identified.  The canal was sounded with a Penfield 4 elevator followed by sequential release of the transverse carpal ligament along its ulnar  border extending into the distal forearm.  The facial anatomy at the distal forearm was noted be rather complex. There were several accessory transverse carpal ligaments palpable, therefore we extended the incision 1 cm proximally to safely allow visualization of these under direct vision.  The accessory transverse carpal ligaments were released with scissors dissection under direct vision.  The volar forearm fascia split 4 cm above the distal wrist flexion crease.  The wound was inspected for  bleeding points which were then electrocauterized with bipolar current.  The skin was then repaired with segmental intradermal 3-0 Prolene suture.  A compressive dressing was applied with a volar plaster splint maintaining the wrist in 15 degrees of dorsiflexion.  For aftercare, Megan Kidd was provided a prescription for hydrocodone 5 mg 1 p.o. q.4-6 hours p.r.n. pain, 20 tabs refill.  We confirmed in the holding area preoperatively that she could tolerate hydrocodone despite her multiple drug intolerances and allergies.     Megan Mighty Megan Kidd, M.D.     RVS/MEDQ  D:  07/07/2013  T:  07/08/2013  Job:  [id number]  cc:   Megan Kidd, M.D.

## 2013-07-11 ENCOUNTER — Encounter (HOSPITAL_BASED_OUTPATIENT_CLINIC_OR_DEPARTMENT_OTHER): Payer: Self-pay | Admitting: Orthopedic Surgery

## 2013-07-27 LAB — HEMOGLOBIN, FINGERSTICK: Hemoglobin, fingerstick: 13.8 g/dL (ref 12.0–16.0)

## 2013-12-26 ENCOUNTER — Encounter (HOSPITAL_BASED_OUTPATIENT_CLINIC_OR_DEPARTMENT_OTHER): Payer: Self-pay | Admitting: Orthopedic Surgery

## 2014-03-03 ENCOUNTER — Other Ambulatory Visit: Payer: Self-pay | Admitting: Sports Medicine

## 2014-03-03 DIAGNOSIS — M545 Low back pain: Secondary | ICD-10-CM

## 2014-03-12 ENCOUNTER — Ambulatory Visit
Admission: RE | Admit: 2014-03-12 | Discharge: 2014-03-12 | Disposition: A | Discharge status: Home / Self Care | Payer: BLUE CROSS/BLUE SHIELD | Source: Ambulatory Visit | Attending: Sports Medicine | Admitting: Sports Medicine

## 2014-03-12 DIAGNOSIS — M545 Low back pain: Secondary | ICD-10-CM

## 2014-06-15 ENCOUNTER — Ambulatory Visit (INDEPENDENT_AMBULATORY_CARE_PROVIDER_SITE_OTHER): Payer: BLUE CROSS/BLUE SHIELD | Admitting: Nurse Practitioner

## 2014-06-15 ENCOUNTER — Encounter: Payer: Self-pay | Admitting: Nurse Practitioner

## 2014-06-15 VITALS — BP 108/64 | HR 64 | Ht 62.25 in | Wt 155.0 lb

## 2014-06-15 DIAGNOSIS — Z1211 Encounter for screening for malignant neoplasm of colon: Secondary | ICD-10-CM

## 2014-06-15 DIAGNOSIS — Z0000 Encounter for general adult medical examination without abnormal findings: Secondary | ICD-10-CM

## 2014-06-15 DIAGNOSIS — Z01419 Encounter for gynecological examination (general) (routine) without abnormal findings: Secondary | ICD-10-CM | POA: Diagnosis not present

## 2014-06-15 MED ORDER — ESTRADIOL 1 MG PO TABS: ORAL_TABLET | ORAL | Status: DC

## 2014-06-15 MED ORDER — ESTRADIOL 0.1 MG/GM VA CREA: TOPICAL_CREAM | VAGINAL | Status: DC

## 2014-06-15 MED ORDER — ESTRADIOL 10 MCG VA TABS: 1.0000 | ORAL_TABLET | VAGINAL | Status: DC

## 2014-06-15 NOTE — Patient Instructions (Addendum)

## 2014-06-15 NOTE — Progress Notes (Signed)
Patient ID: Megan Kidd, female   DOB: [birth year], 59 y.o.   MRN: [id number] 59 y.o. G28P2002 Married  Caucasian Fe here for annual exam.  Father passed last week.   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  Patient's last menstrual period was 06/25/1993.          Sexually active: Yes.    The current method of family planning is status post hysterectomy.    Exercising: No.  The patient does not participate in regular exercise at present. Smoker:  no  Health Maintenance: Pap:  07/30/98, negative (TAH) MMG:  06/24/13, Bi-Rads 1:  Negative Colonoscopy:  07/14/05, colitis, repeat in 10 years BMD:   07/11/11, 0.2/-1.5 TDaP:  UTD? Shingles: 2015 Labs:  HB:  13.3  Urine:  neg * reports that she has never smoked. She has never used smokeless tobacco. She reports that she drinks alcohol. She reports that she does not use illicit drugs.  Past Medical History  Diagnosis Date  . Recurrent UTI   . Fibroids   . Endometriosis   . Contact lens/glasses fitting     wears contacts or glasses  . Depression   . Anxiety   . Complication of anesthesia     hard to wake from general anesthesia    Past Surgical History  Procedure Laterality Date  . Hysteroscopy  12/92  . Dilation and curettage of uterus  12/92    Hysteroscopy  . Bunionectomy Left 04/2011  . Cesarean section      x2  . Shoulder surgery      2202-2005 bilateral frozen shoulders  . Torn tendon      right elbow  . Appendectomy  1995  . Abdominal hysterectomy  06/25/93    secondary to endometriosis  . Colonoscopy w/ biopsies  06/25/2005    colitis - recheck in 10 years  . Shoulder arthroscopy Right 10/15/2000    with debridement, DCE and manipulation  . Carpal tunnel release Right 07/07/2013    Procedure: RIGHT CARPAL TUNNEL RELEASE;  Surgeon: Cammie Sickle., MD;  Location: [place];  Service: Orthopedics;  Laterality: Right;    Current Outpatient Prescriptions  Medication Sig  Dispense Refill  . ALPRAZolam (XANAX) 0.25 MG tablet Take 1 tablet by mouth 2 (two) times daily.  2  . Ascorbic Acid (VITAMIN C PO) Take by mouth.    Marland Kitchen aspirin 81 MG tablet Take 81 mg by mouth daily.    . B Complex Vitamins (B COMPLEX PO) Take by mouth.    . Biotin 10 MG TABS Take 1 tablet by mouth daily.    Marland Kitchen buPROPion (WELLBUTRIN XL) 150 MG 24 hr tablet Take 450 mg by mouth daily.     . cholecalciferol (VITAMIN D) 1000 UNITS tablet Take 1,000 Units by mouth daily.    Marland Kitchen CRANBERRY PO Take by mouth.    . estradiol (ESTRACE) 0.1 MG/GM vaginal cream Use pea size amount at urethra prn 42.5 g 3  . estradiol (ESTRACE) 1 MG tablet 1/2 tablet daily 90 tablet 3  . Estradiol (VAGIFEM) 10 MCG TABS vaginal tablet Place 1 tablet (10 mcg total) vaginally 2 (two) times a week. 24 tablet 3  . HYDROcodone-acetaminophen (NORCO/VICODIN) 5-325 MG per tablet Take 1 tablet by mouth as needed. For migraine  0  . MAGNESIUM PO Take by mouth.    . meloxicam (MOBIC) 15 MG tablet Take 1 tablet by mouth daily.    . nitrofurantoin,  macrocrystal-monohydrate, (MACROBID) 100 MG capsule Take 1 capsule (100 mg total) by mouth as needed (after intercourse). 30 capsule 3  . Omega-3 Fatty Acids (FISH OIL PO) Take by mouth.    . Probiotic Product (PROBIOTIC PO) Take by mouth.    . Vilazodone HCl (VIIBRYD) 40 MG TABS Take 40 mg by mouth daily.      No current facility-administered medications for this visit.    Family History  Problem Relation Age of Onset  . Hypertension Mother   . Osteoarthritis Mother   . Rheum arthritis Mother   . Heart failure Father   . Hypertension Father   . Lung cancer Father 85  . COPD Father   . Depression Father   . Depression Sister     X 2  . Hypertension Maternal Grandfather   . Stroke Maternal Grandfather   . Parkinson's disease Maternal Grandmother   . Heart failure Paternal Uncle   . Depression Paternal Grandmother   . Heart failure Paternal Grandfather   . Lung cancer Paternal  Uncle   . Heart failure Paternal Uncle   . Heart disease Father     CABG X 3 in 1990 mid 60's    ROS:  Pertinent items are noted in HPI.  Otherwise, a comprehensive ROS was negative.  Exam:   BP 108/64 mmHg  Pulse 64  Ht 5' 2.25" (1.581 m)  Wt 155 lb (70.308 kg)  BMI 28.13 kg/m2  LMP 06/25/1993 Height: 5' 2.25" (158.1 cm) Ht Readings from Last 3 Encounters:  06/15/14 5' 2.25" (1.581 m)  07/07/13 5\' 2"  (1.575 m)  06/13/13 5' 2.25" (1.581 m)    General appearance: alert, cooperative and appears stated age Head: Normocephalic, without obvious abnormality, atraumatic Neck: no adenopathy, supple, symmetrical, trachea midline and thyroid normal to inspection and palpation Lungs: clear to auscultation bilaterally Breasts: normal appearance, no masses or tenderness Heart: regular rate and rhythm Abdomen: soft, non-tender; no masses,  no organomegaly Extremities: extremities normal, atraumatic, no cyanosis or edema Skin: Skin color, texture, turgor normal. No rashes or lesions Lymph nodes: Cervical, supraclavicular, and axillary nodes normal. No abnormal inguinal nodes palpated Neurologic: Grossly normal   Pelvic: External genitalia:  no lesions              Urethra:  normal appearing urethra with no masses, tenderness or lesions              Bartholin's and Skene's: normal                 Vagina: normal appearing vagina with normal color and discharge, no lesions              Cervix: absent              Pap taken: No. Bimanual Exam:  Uterus:  uterus absent              Adnexa: no mass, fullness, tenderness               Rectovaginal: Confirms               Anus:  normal sphincter tone, no lesions  Chaperone present: no  A:  Well Woman with normal exam  S/P TAH 5/95 secondary to endometriosis on ERT Atrophic vaginitis and UTI improved on vaginal E  Grief reaction loss of father last week   P:   Reviewed health and wellness pertinent to exam  Pap smear  taken today  Mammogram is due 06/2014  Refill on Vagifem twice weekly for a year  Refill on Estrace pea size amount at the urethra prn  Refill on Estradiol 0.1 mg 1/2 tablet daily  Counseled with risk of CVA, DVT, cancer, etc.  IFOB is given  Condolence and support is given  Counseled on breast self exam, mammography screening, adequate intake of calcium and vitamin D, diet and exercise, Kegel's exercises return annually or prn  An After Visit Summary was printed and given to the patient.

## 2014-06-18 NOTE — Progress Notes (Signed)
Encounter reviewed by Dr. Josefa Half. Estrace dose is 1.0 mg, 1/2 tablet orally daily.

## 2014-06-19 LAB — POCT URINALYSIS DIPSTICK
BILIRUBIN UA: NEGATIVE
GLUCOSE UA: NEGATIVE
Ketones, UA: NEGATIVE
Leukocytes, UA: NEGATIVE
NITRITE UA: NEGATIVE
Protein, UA: NEGATIVE
RBC UA: NEGATIVE
UROBILINOGEN UA: NEGATIVE
pH, UA: 5

## 2014-06-19 LAB — HEMOGLOBIN, FINGERSTICK: HEMOGLOBIN, FINGERSTICK: 13.3 g/dL (ref 12.0–16.0)

## 2014-06-28 ENCOUNTER — Other Ambulatory Visit: Payer: Self-pay | Admitting: Nurse Practitioner

## 2014-06-28 NOTE — Telephone Encounter (Signed)
Medication refill request: Vagifem  Last AEX:  06/15/14 PG Next AEX: 06/18/15 PG Last MMG (if hormonal medication request): 06/27/13 BIRADS1:Neg Refill authorized: 06/15/14 #24tab/ 3 refills to [facility]

## 2014-07-19 ENCOUNTER — Other Ambulatory Visit: Payer: Self-pay | Admitting: Nurse Practitioner

## 2014-07-19 NOTE — Telephone Encounter (Signed)
Medication refill request: Estrace 1mg   tablet  Last AEX:  06/15/14 PG Next AEX: 06/18/15 PG Last MMG (if hormonal medication request): 06/27/13 BIRADS1:Neg Refill authorized: 06/15/14 #90tablet w/ 3R. [facility]

## 2015-01-05 ENCOUNTER — Other Ambulatory Visit: Payer: Self-pay

## 2015-01-05 DIAGNOSIS — Z1231 Encounter for screening mammogram for malignant neoplasm of breast: Secondary | ICD-10-CM

## 2015-02-02 ENCOUNTER — Ambulatory Visit
Admission: RE | Admit: 2015-02-02 | Discharge: 2015-02-02 | Disposition: A | Discharge status: Home / Self Care | Payer: BLUE CROSS/BLUE SHIELD | Source: Ambulatory Visit | Attending: Family Medicine | Admitting: Family Medicine

## 2015-02-02 ENCOUNTER — Other Ambulatory Visit: Payer: Self-pay | Admitting: Family Medicine

## 2015-02-02 DIAGNOSIS — R0781 Pleurodynia: Secondary | ICD-10-CM

## 2015-02-06 ENCOUNTER — Ambulatory Visit: Payer: BLUE CROSS/BLUE SHIELD

## 2015-02-06 ENCOUNTER — Other Ambulatory Visit: Payer: Self-pay | Admitting: Family Medicine

## 2015-02-06 ENCOUNTER — Ambulatory Visit
Admission: RE | Admit: 2015-02-06 | Discharge: 2015-02-06 | Disposition: A | Discharge status: Home / Self Care | Payer: BLUE CROSS/BLUE SHIELD | Source: Ambulatory Visit | Attending: Family Medicine | Admitting: Family Medicine

## 2015-02-06 DIAGNOSIS — R9389 Abnormal findings on diagnostic imaging of other specified body structures: Secondary | ICD-10-CM

## 2015-02-13 ENCOUNTER — Other Ambulatory Visit: Payer: Self-pay | Admitting: Sports Medicine

## 2015-02-13 ENCOUNTER — Other Ambulatory Visit (HOSPITAL_COMMUNITY): Payer: Self-pay | Admitting: Orthopedic Surgery

## 2015-02-13 DIAGNOSIS — T07XXXA Unspecified multiple injuries, initial encounter: Secondary | ICD-10-CM

## 2015-04-02 ENCOUNTER — Ambulatory Visit
Admission: RE | Admit: 2015-04-02 | Discharge: 2015-04-02 | Disposition: A | Discharge status: Home / Self Care | Payer: BLUE CROSS/BLUE SHIELD | Source: Ambulatory Visit | Attending: Sports Medicine | Admitting: Sports Medicine

## 2015-04-02 ENCOUNTER — Ambulatory Visit: Payer: BLUE CROSS/BLUE SHIELD

## 2015-04-02 DIAGNOSIS — T07XXXA Unspecified multiple injuries, initial encounter: Secondary | ICD-10-CM

## 2015-06-05 DIAGNOSIS — M67472 Ganglion, left ankle and foot: Secondary | ICD-10-CM | POA: Diagnosis not present

## 2015-06-07 DIAGNOSIS — F419 Anxiety disorder, unspecified: Secondary | ICD-10-CM | POA: Diagnosis not present

## 2015-06-11 DIAGNOSIS — M25522 Pain in left elbow: Secondary | ICD-10-CM | POA: Diagnosis not present

## 2015-06-18 ENCOUNTER — Encounter: Payer: Self-pay | Admitting: Nurse Practitioner

## 2015-06-18 ENCOUNTER — Ambulatory Visit (INDEPENDENT_AMBULATORY_CARE_PROVIDER_SITE_OTHER): Payer: BLUE CROSS/BLUE SHIELD | Admitting: Nurse Practitioner

## 2015-06-18 VITALS — BP 122/70 | HR 80 | Resp 16 | Ht 62.25 in | Wt 154.0 lb

## 2015-06-18 DIAGNOSIS — Z0000 Encounter for general adult medical examination without abnormal findings: Secondary | ICD-10-CM | POA: Diagnosis not present

## 2015-06-18 DIAGNOSIS — M8580 Other specified disorders of bone density and structure, unspecified site: Secondary | ICD-10-CM | POA: Diagnosis not present

## 2015-06-18 DIAGNOSIS — Z1211 Encounter for screening for malignant neoplasm of colon: Secondary | ICD-10-CM

## 2015-06-18 DIAGNOSIS — N952 Postmenopausal atrophic vaginitis: Secondary | ICD-10-CM

## 2015-06-18 DIAGNOSIS — Z23 Encounter for immunization: Secondary | ICD-10-CM | POA: Diagnosis not present

## 2015-06-18 DIAGNOSIS — Z79890 Hormone replacement therapy (postmenopausal): Secondary | ICD-10-CM

## 2015-06-18 DIAGNOSIS — Z01419 Encounter for gynecological examination (general) (routine) without abnormal findings: Secondary | ICD-10-CM | POA: Diagnosis not present

## 2015-06-18 LAB — POCT URINALYSIS DIPSTICK
Bilirubin, UA: NEGATIVE
Blood, UA: NEGATIVE
Glucose, UA: NEGATIVE
Ketones, UA: NEGATIVE
LEUKOCYTES UA: NEGATIVE
Nitrite, UA: NEGATIVE
PROTEIN UA: NEGATIVE
Urobilinogen, UA: NEGATIVE
pH, UA: 5

## 2015-06-18 LAB — HEPATITIS C ANTIBODY: HCV AB: NEGATIVE

## 2015-06-18 LAB — HIV ANTIBODY (ROUTINE TESTING W REFLEX): HIV: NONREACTIVE

## 2015-06-18 MED ORDER — ESTRADIOL 0.1 MG/GM VA CREA: TOPICAL_CREAM | VAGINAL | Status: DC

## 2015-06-18 MED ORDER — ESTRADIOL 1 MG PO TABS: ORAL_TABLET | ORAL | Status: DC

## 2015-06-18 MED ORDER — ESTRADIOL 10 MCG VA TABS: 1.0000 | ORAL_TABLET | VAGINAL | Status: DC

## 2015-06-18 MED ORDER — NITROFURANTOIN MONOHYD MACRO 100 MG PO CAPS: 100.0000 mg | ORAL_CAPSULE | ORAL | Status: DC | PRN

## 2015-06-18 NOTE — Patient Instructions (Signed)

## 2015-06-18 NOTE — Progress Notes (Signed)
60 y.o. G9P2002 Married  Caucasian Fe here for annual exam.  Daughter age 90 got pregnant with insemination and is now at 17 weeks.  They had now found out the baby is without a brain -only developed to the brain stem.  Daughter wants to carry the baby for the time being.  All the family is having a hard time with the news.   She had ganglion cyst in left foot removed 03/2015.  She also had a fractured rib in November after lifting  a metal shelving unit into the attic.  She had 2 spinal epidural steroids in the past for back pain.  Now torn tendon left elbow.  Her BMD showed mild Osteopenia.  Patient's last menstrual period was 06/25/1993.          Sexually active: Yes.    The current method of family planning is status post hysterectomy.    Exercising: No.  The patient does not participate in regular exercise at present. Smoker:  no  Health Maintenance: Pap:  07/30/1998 Neg MMG:  06/27/13 BIRADS1:neg. (Postponed last year secondary to fractured ribs). Has apt 06/25/15 Colonoscopy:  07/14/05 Colitis - repeat 10 years  BMD:  04/02/15 Osteopenia of hip. TDaP:  Unsure  needs to update today Shingles: 2015  Hep C and HIV: done today Labs: PCP UA: Negative   reports that she has never smoked. She has never used smokeless tobacco. She reports that she drinks alcohol. She reports that she does not use illicit drugs.  Past Medical History  Diagnosis Date  . Recurrent UTI   . Fibroids   . Endometriosis   . Contact lens/glasses fitting     wears contacts or glasses  . Depression   . Anxiety   . Complication of anesthesia     hard to wake from general anesthesia    Past Surgical History  Procedure Laterality Date  . Hysteroscopy  12/92  . Dilation and curettage of uterus  12/92    Hysteroscopy  . Bunionectomy Left 04/2011  . Cesarean section      x2  . Shoulder surgery      2202-2005 bilateral frozen shoulders  . Torn tendon      right elbow  . Appendectomy  1995  . Abdominal hysterectomy   06/25/93    secondary to endometriosis  . Colonoscopy w/ biopsies  06/25/2005    colitis - recheck in 10 years  . Shoulder arthroscopy Right 10/15/2000    with debridement, DCE and manipulation  . Carpal tunnel release Right 07/07/2013    Procedure: RIGHT CARPAL TUNNEL RELEASE;  Surgeon: Cammie Sickle., MD;  Location: [place];  Service: Orthopedics;  Laterality: Right;    Current Outpatient Prescriptions  Medication Sig Dispense Refill  . ALPRAZolam (XANAX) 0.25 MG tablet Take 1 tablet by mouth 2 (two) times daily as needed.   2  . Ascorbic Acid (VITAMIN C PO) Take by mouth.    Marland Kitchen aspirin 81 MG tablet Take 81 mg by mouth daily.    . B Complex Vitamins (B COMPLEX PO) Take by mouth.    . Biotin 10 MG TABS Take 1 tablet by mouth daily.    Marland Kitchen buPROPion (WELLBUTRIN XL) 150 MG 24 hr tablet Take 450 mg by mouth daily.     . cholecalciferol (VITAMIN D) 1000 UNITS tablet Take 1,000 Units by mouth daily.    Marland Kitchen CRANBERRY PO Take by mouth.    . estradiol (ESTRACE) 0.1 MG/GM vaginal cream  Use pea size amount at urethra prn 42.5 g 0  . estradiol (ESTRACE) 1 MG tablet 1/2 tablet daily 30 tablet 0  . Estradiol (VAGIFEM) 10 MCG TABS vaginal tablet Place 1 tablet (10 mcg total) vaginally 2 (two) times a week. 24 tablet 0  . MAGNESIUM PO Take by mouth.    . meloxicam (MOBIC) 15 MG tablet Take 1 tablet by mouth daily.    . Naproxen-Esomeprazole (VIMOVO) 375-20 MG TBEC Take by mouth.    . nitrofurantoin, macrocrystal-monohydrate, (MACROBID) 100 MG capsule Take 1 capsule (100 mg total) by mouth as needed (after intercourse). 30 capsule 3  . Omega-3 Fatty Acids (FISH OIL PO) Take by mouth.    . Probiotic Product (PROBIOTIC PO) Take by mouth.    . Vilazodone HCl (VIIBRYD) 40 MG TABS Take 40 mg by mouth daily.      No current facility-administered medications for this visit.    Family History  Problem Relation Age of Onset  . Hypertension Mother   . Osteoarthritis Mother   . Rheum  arthritis Mother   . Heart failure Father   . Hypertension Father   . Lung cancer Father 38  . COPD Father   . Depression Father   . Depression Sister     X 2  . Hypertension Maternal Grandfather   . Stroke Maternal Grandfather   . Parkinson's disease Maternal Grandmother   . Heart failure Paternal Uncle   . Depression Paternal Grandmother   . Heart failure Paternal Grandfather   . Lung cancer Paternal Uncle   . Heart failure Paternal Uncle   . Heart disease Father     CABG X 3 in 1990 mid 60's    ROS:  Pertinent items are noted in HPI.  Otherwise, a comprehensive ROS was negative.  Exam:   BP 122/70 mmHg  Pulse 80  Resp 16  Ht 5' 2.25" (1.581 m)  Wt 154 lb (69.854 kg)  BMI 27.95 kg/m2  LMP 06/25/1993 Height: 5' 2.25" (158.1 cm) Ht Readings from Last 3 Encounters:  06/18/15 5' 2.25" (1.581 m)  06/15/14 5' 2.25" (1.581 m)  07/07/13 5\' 2"  (1.575 m)    General appearance: alert, cooperative and appears stated age Head: Normocephalic, without obvious abnormality, atraumatic Neck: no adenopathy, supple, symmetrical, trachea midline and thyroid normal to inspection and palpation Lungs: clear to auscultation bilaterally Breasts: normal appearance, no masses or tenderness she has previously expressed a sebaceous cyst above the right nipple and is almost healed. Heart: regular rate and rhythm Abdomen: soft, non-tender; no masses,  no organomegaly Extremities: extremities normal, atraumatic, no cyanosis or edema Skin: Skin color, texture, turgor normal. No rashes or lesions Lymph nodes: Cervical, supraclavicular, and axillary nodes normal. No abnormal inguinal nodes palpated Neurologic: Grossly normal   Pelvic: External genitalia:  no lesions              Urethra:  normal appearing urethra with no masses, tenderness or lesions              Bartholin's and Skene's: normal                 Vagina: normal appearing vagina with normal color and discharge, no lesions               Cervix: absent              Pap taken: No. Bimanual Exam:  Uterus:  uterus absent  Adnexa: no mass, fullness, tenderness               Rectovaginal: Confirms               Anus:  normal sphincter tone, no lesions  Chaperone present: no  A:  Well Woman with normal exam  S/P TAH 5/95 secondary to endometriosis on ERT Atrophic vaginitis and UTI improved on vaginal E Grief reaction with new granddaughter  Osteopenia - will get labs done by Dr. Layne Benton  Immunization update  P:   Reviewed health and wellness pertinent to exam  Pap smear as above  Mammogram is due and scheduled  Refill on Estrace 1 mg - 1/2 tablet 1 months only until Mammo  Refill on Estrace cream to the urethra X 1 until Mammo along with Macrobid to use PC  Refill on Vagifem X 1 until Mammo  Counseled with risk of DVT, CVA, cancer, etc.  Update TDaP today  Counseled on breast self exam, mammography screening, use and side effects of HRT, adequate intake of calcium and vitamin D, diet and exercise return annually or prn  An After Visit Summary was printed and given to the patient.

## 2015-06-20 ENCOUNTER — Telehealth: Payer: Self-pay

## 2015-06-20 MED ORDER — ESTROGENS, CONJUGATED 0.625 MG/GM VA CREA: TOPICAL_CREAM | VAGINAL | Status: DC

## 2015-06-20 NOTE — Telephone Encounter (Signed)
Received PA request from [facility] for patient's rx for Estrace vaginal cream. As the patient has not tried and failed 2 alternative options PA will not be approved. Would it be okay to switch to Premarin as this is usually covered by [employer].

## 2015-06-20 NOTE — Telephone Encounter (Signed)
Yes OK to change to Premarin and see if covered.

## 2015-06-20 NOTE — Telephone Encounter (Signed)
Spoke with patient. Advised we have been notified by her pharmacy that a PA will be required for her Estrace cream for coverage. Advised as she has not tried the preferred alternative medications from the insurance company PA will be denied. Advised I have spoken with Kem Boroughs, FNP and we may try alternative Premarin cream which is typically covered by [facility]. She is agreeable. Rx for Premarin apply a pea size amount to urethra as needed 30 g 0RF sent to pharmacy on file. Patient is agreeable.  Routing to provider for final review. Patient agreeable to disposition. Will close encounter.

## 2015-06-21 DIAGNOSIS — H04129 Dry eye syndrome of unspecified lacrimal gland: Secondary | ICD-10-CM | POA: Diagnosis not present

## 2015-06-22 NOTE — Progress Notes (Signed)
Encounter reviewed by Dr. Brook Amundson C. Silva.  

## 2015-06-25 ENCOUNTER — Ambulatory Visit
Admission: RE | Admit: 2015-06-25 | Discharge: 2015-06-25 | Disposition: A | Discharge status: Home / Self Care | Payer: BLUE CROSS/BLUE SHIELD | Source: Ambulatory Visit

## 2015-06-25 DIAGNOSIS — Z1231 Encounter for screening mammogram for malignant neoplasm of breast: Secondary | ICD-10-CM | POA: Diagnosis not present

## 2015-06-26 ENCOUNTER — Telehealth: Payer: Self-pay | Admitting: *Deleted

## 2015-06-26 NOTE — Telephone Encounter (Signed)
Notes Recorded by Elroy Channel, CMA on 06/26/2015 at 11:42 AM Left voicemail for pt re: results sent thru MyChart Notes Recorded by Kem Boroughs, FNP on 06/19/2015 at 12:46 PM Results via my chart:  Megan Kidd, the Hep C and HIV is negative as expected.

## 2015-07-17 DIAGNOSIS — M7712 Lateral epicondylitis, left elbow: Secondary | ICD-10-CM | POA: Diagnosis not present

## 2015-07-24 LAB — FECAL OCCULT BLOOD, IMMUNOCHEMICAL: IMMUNOLOGICAL FECAL OCCULT BLOOD TEST: NEGATIVE

## 2015-07-24 NOTE — Addendum Note (Signed)
Addended by: Susanne Greenhouse E on: 07/24/2015 10:59 AM   Modules accepted: Orders

## 2015-09-21 ENCOUNTER — Ambulatory Visit
Admission: RE | Admit: 2015-09-21 | Discharge: 2015-09-21 | Disposition: A | Discharge status: Home / Self Care | Payer: BLUE CROSS/BLUE SHIELD | Source: Ambulatory Visit | Attending: Family Medicine | Admitting: Family Medicine

## 2015-09-21 ENCOUNTER — Other Ambulatory Visit: Payer: Self-pay | Admitting: Family Medicine

## 2015-09-21 DIAGNOSIS — R0781 Pleurodynia: Secondary | ICD-10-CM | POA: Diagnosis not present

## 2015-10-15 ENCOUNTER — Other Ambulatory Visit: Payer: Self-pay | Admitting: Family Medicine

## 2015-10-15 DIAGNOSIS — Z23 Encounter for immunization: Secondary | ICD-10-CM | POA: Diagnosis not present

## 2015-10-15 DIAGNOSIS — R079 Chest pain, unspecified: Secondary | ICD-10-CM | POA: Diagnosis not present

## 2015-10-16 DIAGNOSIS — R635 Abnormal weight gain: Secondary | ICD-10-CM | POA: Diagnosis not present

## 2015-10-16 DIAGNOSIS — R194 Change in bowel habit: Secondary | ICD-10-CM | POA: Diagnosis not present

## 2015-10-16 DIAGNOSIS — R1011 Right upper quadrant pain: Secondary | ICD-10-CM | POA: Diagnosis not present

## 2015-10-16 DIAGNOSIS — Z1211 Encounter for screening for malignant neoplasm of colon: Secondary | ICD-10-CM | POA: Diagnosis not present

## 2015-10-19 ENCOUNTER — Ambulatory Visit
Admission: RE | Admit: 2015-10-19 | Discharge: 2015-10-19 | Disposition: A | Discharge status: Home / Self Care | Payer: BLUE CROSS/BLUE SHIELD | Source: Ambulatory Visit | Attending: Family Medicine | Admitting: Family Medicine

## 2015-10-19 DIAGNOSIS — R079 Chest pain, unspecified: Secondary | ICD-10-CM | POA: Diagnosis not present

## 2015-10-19 MED ORDER — IOPAMIDOL (ISOVUE-300) INJECTION 61%
75.0000 mL | Freq: Once | INTRAVENOUS | Status: AC | PRN
  Administered 2015-10-19: 75 mL via INTRAVENOUS

## 2015-10-24 ENCOUNTER — Other Ambulatory Visit (HOSPITAL_COMMUNITY): Payer: Self-pay | Admitting: Family Medicine

## 2015-10-24 DIAGNOSIS — M47819 Spondylosis without myelopathy or radiculopathy, site unspecified: Secondary | ICD-10-CM

## 2015-10-31 DIAGNOSIS — K6289 Other specified diseases of anus and rectum: Secondary | ICD-10-CM | POA: Diagnosis not present

## 2015-10-31 DIAGNOSIS — K621 Rectal polyp: Secondary | ICD-10-CM | POA: Diagnosis not present

## 2015-10-31 DIAGNOSIS — K6389 Other specified diseases of intestine: Secondary | ICD-10-CM | POA: Diagnosis not present

## 2015-10-31 DIAGNOSIS — Z1211 Encounter for screening for malignant neoplasm of colon: Secondary | ICD-10-CM | POA: Diagnosis not present

## 2015-10-31 DIAGNOSIS — D128 Benign neoplasm of rectum: Secondary | ICD-10-CM | POA: Diagnosis not present

## 2015-10-31 DIAGNOSIS — D124 Benign neoplasm of descending colon: Secondary | ICD-10-CM | POA: Diagnosis not present

## 2015-10-31 DIAGNOSIS — K635 Polyp of colon: Secondary | ICD-10-CM | POA: Diagnosis not present

## 2015-11-06 ENCOUNTER — Encounter (HOSPITAL_COMMUNITY)
Admission: RE | Admit: 2015-11-06 | Discharge: 2015-11-06 | Disposition: A | Discharge status: Home / Self Care | Payer: BLUE CROSS/BLUE SHIELD | Source: Ambulatory Visit | Attending: Family Medicine | Admitting: Family Medicine

## 2015-11-06 ENCOUNTER — Ambulatory Visit (HOSPITAL_COMMUNITY)
Admission: RE | Admit: 2015-11-06 | Discharge: 2015-11-06 | Disposition: A | Discharge status: Home / Self Care | Payer: BLUE CROSS/BLUE SHIELD | Source: Ambulatory Visit | Attending: Family Medicine | Admitting: Family Medicine

## 2015-11-06 DIAGNOSIS — H159 Unspecified disorder of sclera: Secondary | ICD-10-CM | POA: Insufficient documentation

## 2015-11-06 DIAGNOSIS — M47819 Spondylosis without myelopathy or radiculopathy, site unspecified: Secondary | ICD-10-CM

## 2015-11-06 DIAGNOSIS — R0781 Pleurodynia: Secondary | ICD-10-CM | POA: Diagnosis not present

## 2015-11-06 DIAGNOSIS — R079 Chest pain, unspecified: Secondary | ICD-10-CM | POA: Diagnosis not present

## 2015-11-06 MED ORDER — TECHNETIUM TC 99M MEDRONATE IV KIT: 25.0000 | PACK | Freq: Once | INTRAVENOUS | Status: DC | PRN

## 2015-11-08 DIAGNOSIS — E860 Dehydration: Secondary | ICD-10-CM | POA: Diagnosis not present

## 2015-11-15 ENCOUNTER — Other Ambulatory Visit: Payer: Self-pay | Admitting: Family Medicine

## 2015-11-15 DIAGNOSIS — R1011 Right upper quadrant pain: Secondary | ICD-10-CM

## 2015-11-20 ENCOUNTER — Other Ambulatory Visit: Payer: Self-pay | Admitting: Nurse Practitioner

## 2015-11-20 NOTE — Telephone Encounter (Signed)
.  Medication refill request: Estradiol 1 MG tab Last AEX:  06-18-15  Next AEX: 06-18-16 Last MMG (if hormonal medication request): 06-25-15 Refill authorized: please advise

## 2015-11-26 ENCOUNTER — Ambulatory Visit
Admission: RE | Admit: 2015-11-26 | Discharge: 2015-11-26 | Disposition: A | Discharge status: Home / Self Care | Payer: BLUE CROSS/BLUE SHIELD | Source: Ambulatory Visit | Attending: Family Medicine | Admitting: Family Medicine

## 2015-11-26 DIAGNOSIS — R1011 Right upper quadrant pain: Secondary | ICD-10-CM

## 2015-12-13 DIAGNOSIS — R079 Chest pain, unspecified: Secondary | ICD-10-CM | POA: Diagnosis not present

## 2015-12-13 DIAGNOSIS — F419 Anxiety disorder, unspecified: Secondary | ICD-10-CM | POA: Diagnosis not present

## 2015-12-25 ENCOUNTER — Encounter: Payer: Self-pay | Admitting: Thoracic Surgery (Cardiothoracic Vascular Surgery)

## 2015-12-25 ENCOUNTER — Institutional Professional Consult (permissible substitution) (INDEPENDENT_AMBULATORY_CARE_PROVIDER_SITE_OTHER): Payer: BLUE CROSS/BLUE SHIELD | Admitting: Thoracic Surgery (Cardiothoracic Vascular Surgery)

## 2015-12-25 VITALS — BP 155/90 | HR 72 | Resp 20 | Ht 62.25 in | Wt 154.0 lb

## 2015-12-25 DIAGNOSIS — R0781 Pleurodynia: Secondary | ICD-10-CM

## 2015-12-25 NOTE — Progress Notes (Addendum)
PCP is Vena Austria, MD Referring Provider is Maury Dus, MD  Chief Complaint  Patient presents with  . Chest Pain    surgical eval, right sided chest pain, Chest CT 10/19/15, Rib CXR 09/21/15, Bone scan 11/06/15    HPI: 60 year old woman sent for evaluation of right-sided chest wall pain.  Megan Kidd is a 60 year old woman who presents with a chief complaint of right-sided chest and upper abdominal pain over the past 6 months. She had a rib fracture in November 2016. That her initially but the pain pretty much resolved. She also was in a walking boot so she wasn't doing much lifting or heavy physical activity for a few months afterwards. She was helping her husband moved some heavy objects back in May. About a week later she developed a very sharp intense pain in the chest. She continued to have sharp chest pain but it improved but did not resolve completely. More recently she has had continued and worsening pain along the right costal margin. She says this is a dull pain and she often feels like there is a lump building up behind her ribs and pushing her ribs forward. Is aggravated by bending over or lifting her arms. This is not a sharp stabbing pain. It does not seem to be related to food intake. She denies any burning or tingling sensation. She also had shoulder pain for a while, but that resolved with massage therapy.  She saw Dr. Alyson Ingles. Rib films showed a healed rib fracture. A CT of the chest showed a healed right sixth rib fracture, but no cause for the pain. There was a sclerotic lesion and T9. A bone scan was done which showed no activity in that area or in the area of the sixth rib. A gallbladder ultrasound was unremarkable.   Past Medical History:  Diagnosis Date  . Anxiety   . Complication of anesthesia    hard to wake from general anesthesia  . Contact lens/glasses fitting    wears contacts or glasses  . Depression   . Endometriosis   . Fibroids   . Recurrent UTI      Past Surgical History:  Procedure Laterality Date  . ABDOMINAL HYSTERECTOMY  06/25/93   secondary to endometriosis  . APPENDECTOMY  1995  . BUNIONECTOMY Left 04/2011  . CARPAL TUNNEL RELEASE Right 07/07/2013   Procedure: RIGHT CARPAL TUNNEL RELEASE;  Surgeon: Cammie Sickle., MD;  Location: [place];  Service: Orthopedics;  Laterality: Right;  . CESAREAN SECTION     x2  . COLONOSCOPY W/ BIOPSIES  06/25/2005   colitis - recheck in 10 years  . DILATION AND CURETTAGE OF UTERUS  12/92   Hysteroscopy  . HYSTEROSCOPY  12/92  . SHOULDER ARTHROSCOPY Right 10/15/2000   with debridement, DCE and manipulation  . SHOULDER SURGERY     2202-2005 bilateral frozen shoulders  . torn tendon     right elbow    Family History  Problem Relation Age of Onset  . Hypertension Mother   . Osteoarthritis Mother   . Rheum arthritis Mother   . Heart failure Father   . Hypertension Father   . Lung cancer Father 69  . COPD Father   . Depression Father   . Heart disease Father     CABG X 3 in 1990 mid 60's  . Hypertension Maternal Grandfather   . Stroke Maternal Grandfather   . Parkinson's disease Maternal Grandmother   . Depression Paternal Grandmother   .  Heart failure Paternal Grandfather   . Depression Sister     X 2  . Heart failure Paternal Uncle   . Lung cancer Paternal Uncle   . Heart failure Paternal Uncle     Social History Social History  Substance Use Topics  . Smoking status: Never Smoker  . Smokeless tobacco: Never Used  . Alcohol use Yes     Comment: occassionally - wine    Current Outpatient Prescriptions  Medication Sig Dispense Refill  . ALPRAZolam (XANAX) 0.25 MG tablet Take 1 tablet by mouth 2 (two) times daily as needed.   2  . Ascorbic Acid (VITAMIN C PO) Take by mouth.    Marland Kitchen aspirin 81 MG tablet Take 81 mg by mouth daily.    . B Complex Vitamins (B COMPLEX PO) Take by mouth.    . Biotin 10 MG TABS Take 1 tablet by mouth daily.    Marland Kitchen buPROPion  (WELLBUTRIN XL) 150 MG 24 hr tablet Take 450 mg by mouth daily.     . cholecalciferol (VITAMIN D) 1000 UNITS tablet Take 1,000 Units by mouth daily.    Marland Kitchen conjugated estrogens (PREMARIN) vaginal cream Apply a pea sized amount at the urethra prn. 30 g 0  . CRANBERRY PO Take by mouth.    . estradiol (ESTRACE) 1 MG tablet TAKE 1/2 TABLET BY MOUTH DAILY 90 tablet 2  . Estradiol (VAGIFEM) 10 MCG TABS vaginal tablet Place 1 tablet (10 mcg total) vaginally 2 (two) times a week. 24 tablet 0  . MAGNESIUM PO Take by mouth.    . meloxicam (MOBIC) 15 MG tablet Take 1 tablet by mouth daily.    . Naproxen-Esomeprazole (VIMOVO) 375-20 MG TBEC Take by mouth.    . nitrofurantoin, macrocrystal-monohydrate, (MACROBID) 100 MG capsule Take 1 capsule (100 mg total) by mouth as needed (after intercourse). 30 capsule 3  . Omega-3 Fatty Acids (FISH OIL PO) Take by mouth.    . Probiotic Product (PROBIOTIC PO) Take by mouth.    . Vilazodone HCl (VIIBRYD) 40 MG TABS Take 40 mg by mouth daily.      No current facility-administered medications for this visit.     Allergies  Allergen Reactions  . Ampicillin Rash  . Ciprofloxacin Rash  . Levaquin [Levofloxacin In D5w] Rash    Rash around chest and neck and under breast  . Macrobid [Nitrofurantoin] Rash    Only in large doses  . Penicillins Rash  . Percocet [Oxycodone-Acetaminophen] Rash  . Septra [Sulfamethoxazole-Trimethoprim] Rash  . Zithromax [Azithromycin] Rash    Review of Systems  Constitutional: Positive for activity change (Limited due to pain). Negative for chills, fever and unexpected weight change.  HENT: Negative for trouble swallowing and voice change.   Eyes: Negative for visual disturbance.  Respiratory: Negative for cough, shortness of breath and wheezing.   Cardiovascular: Positive for chest pain (Right anterior chest wall).  Gastrointestinal: Positive for abdominal pain. Negative for blood in stool.  Genitourinary: Negative for difficulty  urinating and dysuria.  Musculoskeletal: Positive for arthralgias and joint swelling.  Neurological: Negative for dizziness and syncope.  Psychiatric/Behavioral: Positive for dysphoric mood. The patient is nervous/anxious.   All other systems reviewed and are negative.   BP (!) 155/90 (BP Location: Right Arm, Patient Position: Sitting, Cuff Size: Normal)   Pulse 72   Resp 20   Ht 5' 2.25" (1.581 m)   Wt 154 lb (69.9 kg)   LMP 06/25/1993   SpO2 99% Comment: RA  BMI 27.94  kg/m  Physical Exam  Constitutional: She is oriented to person, place, and time. She appears well-developed and well-nourished. No distress.  HENT:  Head: Normocephalic and atraumatic.  Mouth/Throat: No oropharyngeal exudate.  Eyes: EOM are normal. Pupils are equal, round, and reactive to light. No scleral icterus.  Neck: Neck supple. No thyromegaly present.  Cardiovascular: Normal rate, regular rhythm, normal heart sounds and intact distal pulses.   No murmur heard. Pulmonary/Chest: Effort normal and breath sounds normal. No respiratory distress. She has no wheezes. She has no rales. She exhibits tenderness (Mild along right costal margin).  Abdominal: Soft. She exhibits no distension.  Musculoskeletal: Normal range of motion. She exhibits no edema.  Lymphadenopathy:    She has no cervical adenopathy.  Neurological: She is alert and oriented to person, place, and time. No cranial nerve deficit. She exhibits normal muscle tone.  Skin: Skin is warm and dry.  Vitals reviewed.    Diagnostic Tests: RIGHT RIBS AND CHEST - 3+ VIEW COMPARISON:  Chest radiograph 02/06/2015 FINDINGS: Cortical irregularity in the lateral aspect of sixth right rib may represent a healing fracture. There is a focally sclerotic appearance to this portion of the rib. No other rib fractures are seen. The visualized lung parenchyma is normal. Normal cardiomediastinal silhouette. IMPRESSION: Cortical irregularity of the lateral aspect of  6 right rib may represent a healing fracture. Please note that there is a focal sclerotic lesion adjacent to the site of fracture, which may represent callus formation, however sclerotic lesion with pathologic fracture cannot be excluded. Electronically Signed   By: Fidela Salisbury M.D.   On: 09/21/2015 15:44 CT CHEST WITH CONTRAST  TECHNIQUE: Multidetector CT imaging of the chest was performed during intravenous contrast administration.  CONTRAST:  19mL ISOVUE-300 IOPAMIDOL (ISOVUE-300) INJECTION 61%  COMPARISON:  09/21/2015 chest x-ray  FINDINGS: Cardiovascular: The heart size is normal.  No pericardial effusion.  Mediastinum/Nodes: No mediastinal lymphadenopathy. There is no hilar lymphadenopathy. The esophagus has normal imaging features. There is no axillary lymphadenopathy.  Lungs/Pleura: Biapical pleural-parenchymal scarring is noted symmetrically. No focal airspace consolidation. No suspicious pulmonary nodule or mass. No pulmonary edema or pleural effusion.  Upper Abdomen: 2 mm nonobstructing stone identified upper pole left kidney.  Musculoskeletal: 6 mm sclerotic lesion in the posterior T9 vertebral body is nonspecific.  IMPRESSION: 1. No CT findings to explain the patient's history of right-sided rib and chest pain. 2. 6 mm sclerotic T9 lesion, nonspecific. In the absence of a primary cancer history, this is unlikely to be metastatic. Bone island could have this appearance. Nuclear medicine bone scan could be used to determine whether there is active cortical turnover in this lesion.   Electronically Signed   By: Misty Stanley M.D.   On: 10/19/2015 17:35 NUCLEAR MEDICINE WHOLE BODY BONE SCAN  TECHNIQUE: Whole body anterior and posterior images were obtained approximately 3 hours after intravenous injection of radiopharmaceutical.  RADIOPHARMACEUTICALS:  21.1 mCi Technetium-71m MDP IV  COMPARISON:  Chest CT  10/19/2015  FINDINGS: Expected uptake in the renal collecting system and urinary bladder. No suspicious uptake in the axial or appendicular skeleton. No suspicious uptake in the ribs. In particular, there is no suspicious uptake in the T9 vertebral body. Mild degenerative changes in the right knee and both feet.  IMPRESSION: Normal bone scan.   Electronically Signed   By: Markus Daft M.D.   On: 11/06/2015 15:51  Impression: 60 year old woman with an unusual of right-sided chest pain syndrome. She has been having pain fairly consistently  for the past 6 months. The only definitive positive finding on an extensive workup has been a healed right sixth rib fracture from a rib fracture suffered about a year ago. Her pain is different from what she experienced with the rib fracture.  On exam she does have some mild tenderness in that area but does not exactly reproduce her pain. The sensation she has of something pushing out is also unusual for chronic rib pain due to prior fracture. I can't rule out that possibility, but I don't think we should just write it off to that without making sure it is not some underlying issue. The main consideration for alternative diagnosis would be an unusual diaphragmatic hernia possible entrapped omentum. That would fit much better with her symptom complex. There is no mention of that on the CT although it might be difficult to appreciate if not specifically looking for that particular problem.  I reviewed CT multiple times and cannot rule in or out the possibility of a hernia in that area. I will need to review this in person with the radiologist to determine if that is a possibility of any further testing should be done. There certainly is not enough evidence at this point to recommend surgery.  Plan: We will review CT with radiology to evaluate for possibility of an unusual anterior diaphragmatic hernia. We'll also discuss whether MR might be helpful in  better demonstrating if that is a possibility.   Megan Nakayama, MD [facility] [phone] Addendum I personally reviewed the radiographic studies listed above and concur with the findings noted. Revonda Standard Roxan Hockey, MD [facility] [phone]

## 2015-12-26 ENCOUNTER — Telehealth: Payer: Self-pay | Admitting: Thoracic Surgery (Cardiothoracic Vascular Surgery)

## 2015-12-26 NOTE — Telephone Encounter (Signed)
      [address]       [place] [address]             [phone]      I reviewed Mrs. Stensland's films with Radiology. Even when specifically looking for a hernia in the region there is no evidence of one. The only pathology noted is the healed rib fracture. Dr. Earleen Newport does not think MR is likely to be helpful.  I informed her of that discussion  I think the best option at this point is likely referral to a pain clinic for a trial of an intercostal nerve block  Remo Lipps C. Roxan Hockey, MD [facility] [phone]

## 2016-01-04 ENCOUNTER — Encounter (HOSPITAL_BASED_OUTPATIENT_CLINIC_OR_DEPARTMENT_OTHER): Payer: Self-pay | Admitting: *Deleted

## 2016-01-07 NOTE — H&P (Signed)
Megan Kidd is a 60 year-old female who follows up six weeks status post left peroneal longus ganglion excision, as well as sural nerve neurolysis.  She is doing well.  Continues to increase her activity.  Denies any paresthesias, but is having occasional tingling in the one area of the incision.  Denies any fevers, chills, night sweats or erythema in the region.  As well she is having left elbow pain.  She had lateral epicondylitis which had one injection done on February 27, 2015 which did help quite a bit.  Now she is having lateral elbow pain again that is preventing her from lifting things.   Past medical history, exam findings, social and family history are noted and signed in the chart.  Review of systems: Negative other than per HPI.  Allergies: Penicillin, Keflex, Zithromax, Septra, Cipro and Levaquin.  Current medications: No changes.     EXAMINATION: Well-developed, well-nourished Caucasian female in no acute distress.  Alert and oriented x 3.  Examination of the left lower extremity reveals well healed incision along the lateral ankle region.  No drainage.  Range of motion is normal at the ankle.  Normal eversion and inversion strength.  Sensation is intact in the left lower extremity.  Vascular intact in the left lower extremity.  Exam of the left elbow reveals no ecchymosis or increased carrying angle.  She does have tenderness to palpation at the lateral epicondyle and resisted third finger test.  Neurovascularly intact left upper extremity.    X-RAYS: None obtained today.   IMPRESSION: 1. Status post left ganglion cyst removal from the peroneus longus, as well as sural nerve neurolysis performed on April 23, 2015. 2. Left lateral epicondylitis, chronic, non-traumatic.    PLAN: For the ankle we will go ahead and continue to increase her activity and she will follow up in six weeks if she is having any problems.  In regards to the left elbow, we will go ahead and obtain an MRI scan due to  her debilitating symptoms at this time to further delineate whether she needs surgical intervention.  She will follow up after the MRI.    Addendum:  Megan Kidd comes in for follow up.  Persistent symptoms left elbow.  MRI complete.  Picture of tendinopathy with interstitial tearing ECRB tendon.  I went over this with her.  Same situation with eventual operative intervention of the opposite right arm by me back in 2006.  That has done well and she knows what to expect.  I met with her face-to-face going over the scan, report, exam and treatment alternatives.  More than 25 minutes spent.  She wants to put this off until September.  We are going to inject her one more time now just to give her some relief until then.  No further injections.  She understands and agrees.  Paperwork complete.  All questions answered.    PROCEDURE NOTE: The patient's clinical condition is marked by substantial pain and/or significant functional disability.  Other conservative therapy has not provided relief, is contraindicated, or not appropriate.  There is a reasonable likelihood that injection will significantly improve the patient's pain and/or functional disability. After appropriate consent and under sterile technique the area of maximum tenderness deep side of the extensor tendon right off the epicondyle is injected with 40 mg of Depo-Medrol and Marcaine.  Tolerated this well.  Continue stretching.

## 2016-01-09 ENCOUNTER — Encounter (HOSPITAL_BASED_OUTPATIENT_CLINIC_OR_DEPARTMENT_OTHER): Payer: Self-pay | Admitting: Anesthesiology

## 2016-01-09 NOTE — Anesthesia Preprocedure Evaluation (Addendum)
Anesthesia Evaluation  Patient identified by MRN, date of birth, ID band Patient awake    Reviewed: Allergy & Precautions, NPO status , Patient's Chart, lab work & pertinent test results  Airway Mallampati: II     Mouth opening: Limited Mouth Opening  Dental  (+) Teeth Intact, Dental Advisory Given   Pulmonary    breath sounds clear to auscultation       Cardiovascular negative cardio ROS   Rhythm:Regular Rate:Normal     Neuro/Psych  Headaches, PSYCHIATRIC DISORDERS Anxiety Depression    GI/Hepatic negative GI ROS, Neg liver ROS,   Endo/Other  negative endocrine ROS  Renal/GU negative Renal ROS  negative genitourinary   Musculoskeletal negative musculoskeletal ROS (+)   Abdominal   Peds negative pediatric ROS (+)  Hematology negative hematology ROS (+)   Anesthesia Other Findings   Reproductive/Obstetrics negative OB ROS                            Anesthesia Physical Anesthesia Plan  ASA: II  Anesthesia Plan: General   Post-op Pain Management:    Induction: Intravenous  Airway Management Planned: LMA  Additional Equipment:   Intra-op Plan:   Post-operative Plan: Extubation in OR  Informed Consent: I have reviewed the patients History and Physical, chart, labs and discussed the procedure including the risks, benefits and alternatives for the proposed anesthesia with the patient or authorized representative who has indicated his/her understanding and acceptance.     Plan Discussed with: CRNA  Anesthesia Plan Comments:         Anesthesia Quick Evaluation

## 2016-01-10 ENCOUNTER — Ambulatory Visit (HOSPITAL_BASED_OUTPATIENT_CLINIC_OR_DEPARTMENT_OTHER): Payer: BLUE CROSS/BLUE SHIELD | Admitting: Anesthesiology

## 2016-01-10 ENCOUNTER — Encounter (HOSPITAL_BASED_OUTPATIENT_CLINIC_OR_DEPARTMENT_OTHER)
Admission: RE | Disposition: A | Discharge status: Home / Self Care | Payer: Self-pay | Source: Ambulatory Visit | Attending: Orthopedic Surgery

## 2016-01-10 ENCOUNTER — Encounter (HOSPITAL_BASED_OUTPATIENT_CLINIC_OR_DEPARTMENT_OTHER): Payer: Self-pay | Admitting: Certified Registered"

## 2016-01-10 ENCOUNTER — Ambulatory Visit (HOSPITAL_BASED_OUTPATIENT_CLINIC_OR_DEPARTMENT_OTHER)
Admission: RE | Admit: 2016-01-10 | Discharge: 2016-01-10 | Disposition: A | Discharge status: Home / Self Care | Payer: BLUE CROSS/BLUE SHIELD | Source: Ambulatory Visit | Attending: Orthopedic Surgery | Admitting: Orthopedic Surgery

## 2016-01-10 DIAGNOSIS — F419 Anxiety disorder, unspecified: Secondary | ICD-10-CM | POA: Diagnosis not present

## 2016-01-10 DIAGNOSIS — Z7982 Long term (current) use of aspirin: Secondary | ICD-10-CM | POA: Insufficient documentation

## 2016-01-10 DIAGNOSIS — S56512A Strain of other extensor muscle, fascia and tendon at forearm level, left arm, initial encounter: Secondary | ICD-10-CM | POA: Diagnosis not present

## 2016-01-10 DIAGNOSIS — F418 Other specified anxiety disorders: Secondary | ICD-10-CM | POA: Insufficient documentation

## 2016-01-10 DIAGNOSIS — X58XXXA Exposure to other specified factors, initial encounter: Secondary | ICD-10-CM | POA: Diagnosis not present

## 2016-01-10 DIAGNOSIS — Z79899 Other long term (current) drug therapy: Secondary | ICD-10-CM | POA: Diagnosis not present

## 2016-01-10 DIAGNOSIS — M7712 Lateral epicondylitis, left elbow: Secondary | ICD-10-CM | POA: Insufficient documentation

## 2016-01-10 DIAGNOSIS — Z79890 Hormone replacement therapy (postmenopausal): Secondary | ICD-10-CM | POA: Insufficient documentation

## 2016-01-10 DIAGNOSIS — Z791 Long term (current) use of non-steroidal anti-inflammatories (NSAID): Secondary | ICD-10-CM | POA: Insufficient documentation

## 2016-01-10 HISTORY — DX: Headache: R51

## 2016-01-10 HISTORY — DX: Headache, unspecified: R51.9

## 2016-01-10 HISTORY — PX: TENNIS ELBOW RELEASE/NIRSCHEL PROCEDURE: SHX6651

## 2016-01-10 SURGERY — TENNIS ELBOW RELEASE/NIRSCHEL PROCEDURE
Anesthesia: General | Site: Elbow | Laterality: Left

## 2016-01-10 MED ORDER — OXYCODONE-ACETAMINOPHEN 5-325 MG PO TABS: 1.0000 | ORAL_TABLET | ORAL | Status: DC | PRN

## 2016-01-10 MED ORDER — ONDANSETRON HCL 4 MG PO TABS: 4.0000 mg | ORAL_TABLET | Freq: Four times a day (QID) | ORAL | Status: DC | PRN

## 2016-01-10 MED ORDER — PROPOFOL 10 MG/ML IV BOLUS
INTRAVENOUS | Status: DC | PRN
  Administered 2016-01-10: 200 mg via INTRAVENOUS

## 2016-01-10 MED ORDER — LACTATED RINGERS IV SOLN
INTRAVENOUS | Status: DC
  Administered 2016-01-10 (×2): via INTRAVENOUS

## 2016-01-10 MED ORDER — METOCLOPRAMIDE HCL 5 MG PO TABS: 5.0000 mg | ORAL_TABLET | Freq: Three times a day (TID) | ORAL | Status: DC | PRN

## 2016-01-10 MED ORDER — BUPIVACAINE HCL (PF) 0.25 % IJ SOLN
INTRAMUSCULAR | Status: AC
  Filled 2016-01-10: qty 30

## 2016-01-10 MED ORDER — OXYCODONE HCL 5 MG PO TABS
5.0000 mg | ORAL_TABLET | Freq: Once | ORAL | Status: AC | PRN
  Administered 2016-01-10: 5 mg via ORAL

## 2016-01-10 MED ORDER — FENTANYL CITRATE (PF) 100 MCG/2ML IJ SOLN
50.0000 ug | INTRAMUSCULAR | Status: AC | PRN
  Administered 2016-01-10: 50 ug via INTRAVENOUS
  Administered 2016-01-10 (×2): 25 ug via INTRAVENOUS

## 2016-01-10 MED ORDER — LIDOCAINE 2% (20 MG/ML) 5 ML SYRINGE
INTRAMUSCULAR | Status: AC
  Filled 2016-01-10: qty 5

## 2016-01-10 MED ORDER — CLINDAMYCIN PHOSPHATE 900 MG/50ML IV SOLN
INTRAVENOUS | Status: AC
  Filled 2016-01-10: qty 50

## 2016-01-10 MED ORDER — OXYCODONE-ACETAMINOPHEN 5-325 MG PO TABS: 1.0000 | ORAL_TABLET | ORAL | 0 refills | Status: DC | PRN

## 2016-01-10 MED ORDER — FENTANYL CITRATE (PF) 100 MCG/2ML IJ SOLN
INTRAMUSCULAR | Status: AC
  Filled 2016-01-10: qty 2

## 2016-01-10 MED ORDER — 0.9 % SODIUM CHLORIDE (POUR BTL) OPTIME
TOPICAL | Status: DC | PRN
  Administered 2016-01-10: 200 mL

## 2016-01-10 MED ORDER — ONDANSETRON HCL 4 MG/2ML IJ SOLN
INTRAMUSCULAR | Status: DC | PRN
  Administered 2016-01-10: 4 mg via INTRAVENOUS

## 2016-01-10 MED ORDER — LIDOCAINE HCL (CARDIAC) 20 MG/ML IV SOLN
INTRAVENOUS | Status: DC | PRN
  Administered 2016-01-10: 60 mg via INTRAVENOUS

## 2016-01-10 MED ORDER — DEXAMETHASONE SODIUM PHOSPHATE 10 MG/ML IJ SOLN
INTRAMUSCULAR | Status: DC | PRN
  Administered 2016-01-10: 10 mg via INTRAVENOUS

## 2016-01-10 MED ORDER — LACTATED RINGERS IV SOLN: INTRAVENOUS | Status: DC

## 2016-01-10 MED ORDER — MIDAZOLAM HCL 2 MG/2ML IJ SOLN
1.0000 mg | INTRAMUSCULAR | Status: DC | PRN
  Administered 2016-01-10: 2 mg via INTRAVENOUS

## 2016-01-10 MED ORDER — MIDAZOLAM HCL 2 MG/2ML IJ SOLN
INTRAMUSCULAR | Status: AC
  Filled 2016-01-10: qty 2

## 2016-01-10 MED ORDER — CHLORHEXIDINE GLUCONATE 4 % EX LIQD: 60.0000 mL | Freq: Once | CUTANEOUS | Status: DC

## 2016-01-10 MED ORDER — EPHEDRINE SULFATE 50 MG/ML IJ SOLN
INTRAMUSCULAR | Status: DC | PRN
  Administered 2016-01-10 (×3): 10 mg via INTRAVENOUS

## 2016-01-10 MED ORDER — ONDANSETRON HCL 4 MG/2ML IJ SOLN: 4.0000 mg | Freq: Four times a day (QID) | INTRAMUSCULAR | Status: DC | PRN

## 2016-01-10 MED ORDER — PROPOFOL 500 MG/50ML IV EMUL
INTRAVENOUS | Status: AC
  Filled 2016-01-10: qty 50

## 2016-01-10 MED ORDER — METHOCARBAMOL 500 MG PO TABS: 500.0000 mg | ORAL_TABLET | Freq: Four times a day (QID) | ORAL | Status: DC | PRN

## 2016-01-10 MED ORDER — ONDANSETRON HCL 4 MG PO TABS: 4.0000 mg | ORAL_TABLET | Freq: Three times a day (TID) | ORAL | 0 refills | Status: DC | PRN

## 2016-01-10 MED ORDER — OXYCODONE HCL 5 MG/5ML PO SOLN: 5.0000 mg | Freq: Once | ORAL | Status: AC | PRN

## 2016-01-10 MED ORDER — SCOPOLAMINE 1 MG/3DAYS TD PT72: 1.0000 | MEDICATED_PATCH | Freq: Once | TRANSDERMAL | Status: DC | PRN

## 2016-01-10 MED ORDER — HYDROMORPHONE HCL 1 MG/ML IJ SOLN: 0.2500 mg | INTRAMUSCULAR | Status: DC | PRN

## 2016-01-10 MED ORDER — OXYCODONE HCL 5 MG PO TABS
ORAL_TABLET | ORAL | Status: AC
  Filled 2016-01-10: qty 1

## 2016-01-10 MED ORDER — LACTATED RINGERS IV SOLN
INTRAVENOUS | Status: DC
Start: 2016-01-10 — End: 2016-01-10

## 2016-01-10 MED ORDER — METOCLOPRAMIDE HCL 5 MG/ML IJ SOLN: 5.0000 mg | Freq: Three times a day (TID) | INTRAMUSCULAR | Status: DC | PRN

## 2016-01-10 MED ORDER — DEXAMETHASONE SODIUM PHOSPHATE 10 MG/ML IJ SOLN
INTRAMUSCULAR | Status: AC
  Filled 2016-01-10: qty 1

## 2016-01-10 MED ORDER — MEPERIDINE HCL 25 MG/ML IJ SOLN: 6.2500 mg | INTRAMUSCULAR | Status: DC | PRN

## 2016-01-10 MED ORDER — ONDANSETRON HCL 4 MG/2ML IJ SOLN
INTRAMUSCULAR | Status: AC
  Filled 2016-01-10: qty 2

## 2016-01-10 MED ORDER — METHOCARBAMOL 1000 MG/10ML IJ SOLN: 500.0000 mg | Freq: Four times a day (QID) | INTRAVENOUS | Status: DC | PRN

## 2016-01-10 MED ORDER — CLINDAMYCIN PHOSPHATE 900 MG/50ML IV SOLN
900.0000 mg | INTRAVENOUS | Status: AC
  Administered 2016-01-10: 900 mg via INTRAVENOUS

## 2016-01-10 MED ORDER — HYDROMORPHONE HCL 1 MG/ML IJ SOLN: 0.5000 mg | INTRAMUSCULAR | Status: DC | PRN

## 2016-01-10 MED ORDER — PROMETHAZINE HCL 25 MG/ML IJ SOLN: 6.2500 mg | INTRAMUSCULAR | Status: DC | PRN

## 2016-01-10 MED ORDER — BUPIVACAINE HCL (PF) 0.25 % IJ SOLN
INTRAMUSCULAR | Status: DC | PRN
  Administered 2016-01-10: 10 mL

## 2016-01-10 SURGICAL SUPPLY — 63 items
BANDAGE ACE 4X5 VEL STRL LF (GAUZE/BANDAGES/DRESSINGS) ×4 IMPLANT
BENZOIN TINCTURE PRP APPL 2/3 (GAUZE/BANDAGES/DRESSINGS) ×2 IMPLANT
BLADE SURG 15 STRL LF DISP TIS (BLADE) ×1 IMPLANT
BLADE SURG 15 STRL SS (BLADE) ×1
BNDG COHESIVE 4X5 TAN STRL (GAUZE/BANDAGES/DRESSINGS) ×2 IMPLANT
BNDG ESMARK 4X9 LF (GAUZE/BANDAGES/DRESSINGS) ×2 IMPLANT
CANISTER SUCT 1200ML W/VALVE (MISCELLANEOUS) ×2 IMPLANT
COVER BACK TABLE 60X90IN (DRAPES) ×2 IMPLANT
CUFF TOURN SGL LL 18 NRW (TOURNIQUET CUFF) ×2 IMPLANT
CUFF TOURNIQUET SINGLE 18IN (TOURNIQUET CUFF) IMPLANT
DECANTER SPIKE VIAL GLASS SM (MISCELLANEOUS) IMPLANT
DRAPE EXTREMITY T 121X128X90 (DRAPE) ×2 IMPLANT
DRAPE IMP U-DRAPE 54X76 (DRAPES) ×2 IMPLANT
DRAPE U-SHAPE 47X51 STRL (DRAPES) ×2 IMPLANT
DRSG PAD ABDOMINAL 8X10 ST (GAUZE/BANDAGES/DRESSINGS) ×2 IMPLANT
DURAPREP 26ML APPLICATOR (WOUND CARE) ×2 IMPLANT
ELECT REM PT RETURN 9FT ADLT (ELECTROSURGICAL) ×2
ELECTRODE REM PT RTRN 9FT ADLT (ELECTROSURGICAL) ×1 IMPLANT
GAUZE SPONGE 4X4 12PLY STRL (GAUZE/BANDAGES/DRESSINGS) ×2 IMPLANT
GAUZE XEROFORM 1X8 LF (GAUZE/BANDAGES/DRESSINGS) ×2 IMPLANT
GLOVE BIOGEL PI IND STRL 7.0 (GLOVE) ×3 IMPLANT
GLOVE BIOGEL PI IND STRL 8 (GLOVE) ×1 IMPLANT
GLOVE BIOGEL PI INDICATOR 7.0 (GLOVE) ×3
GLOVE BIOGEL PI INDICATOR 8 (GLOVE) ×1
GLOVE ECLIPSE 6.5 STRL STRAW (GLOVE) ×2 IMPLANT
GLOVE ECLIPSE 7.0 STRL STRAW (GLOVE) ×2 IMPLANT
GLOVE SURG ORTHO 8.0 STRL STRW (GLOVE) ×2 IMPLANT
GOWN STRL REUS W/ TWL LRG LVL3 (GOWN DISPOSABLE) IMPLANT
GOWN STRL REUS W/ TWL XL LVL3 (GOWN DISPOSABLE) ×2 IMPLANT
GOWN STRL REUS W/TWL LRG LVL3 (GOWN DISPOSABLE)
GOWN STRL REUS W/TWL XL LVL3 (GOWN DISPOSABLE) ×4 IMPLANT
K-WIRE .045X4 (WIRE) ×2 IMPLANT
NEEDLE 1/2 CIR CATGUT .05X1.09 (NEEDLE) IMPLANT
NEEDLE HYPO 25X1 1.5 SAFETY (NEEDLE) ×2 IMPLANT
NS IRRIG 1000ML POUR BTL (IV SOLUTION) ×2 IMPLANT
PACK BASIN DAY SURGERY FS (CUSTOM PROCEDURE TRAY) ×2 IMPLANT
PAD CAST 4YDX4 CTTN HI CHSV (CAST SUPPLIES) ×2 IMPLANT
PADDING CAST ABS 4INX4YD NS (CAST SUPPLIES) ×1
PADDING CAST ABS COTTON 4X4 ST (CAST SUPPLIES) ×1 IMPLANT
PADDING CAST COTTON 4X4 STRL (CAST SUPPLIES) ×2
PENCIL BUTTON HOLSTER BLD 10FT (ELECTRODE) ×2 IMPLANT
SLEEVE SCD COMPRESS KNEE MED (MISCELLANEOUS) ×2 IMPLANT
SLING ARM FOAM STRAP LRG (SOFTGOODS) IMPLANT
SLING ARM FOAM STRAP MED (SOFTGOODS) ×2 IMPLANT
SLING ARM MED ADULT FOAM STRAP (SOFTGOODS) IMPLANT
STOCKINETTE 4X48 STRL (DRAPES) ×2 IMPLANT
STRIP CLOSURE SKIN 1/2X4 (GAUZE/BANDAGES/DRESSINGS) ×2 IMPLANT
SUCTION FRAZIER HANDLE 10FR (MISCELLANEOUS) ×1
SUCTION TUBE FRAZIER 10FR DISP (MISCELLANEOUS) ×1 IMPLANT
SUT FIBERWIRE #2 38 T-5 BLUE (SUTURE)
SUT VIC AB 0 CT1 27 (SUTURE)
SUT VIC AB 0 CT1 27XBRD ANBCTR (SUTURE) IMPLANT
SUT VIC AB 0 SH 27 (SUTURE) ×2 IMPLANT
SUT VIC AB 2-0 SH 27 (SUTURE) ×1
SUT VIC AB 2-0 SH 27XBRD (SUTURE) ×1 IMPLANT
SUT VICRYL 3-0 CR8 SH (SUTURE) ×2 IMPLANT
SUTURE FIBERWR #2 38 T-5 BLUE (SUTURE) IMPLANT
SYR BULB 3OZ (MISCELLANEOUS) ×2 IMPLANT
SYR CONTROL 10ML LL (SYRINGE) ×2 IMPLANT
TOWEL OR 17X24 6PK STRL BLUE (TOWEL DISPOSABLE) ×2 IMPLANT
TOWEL OR NON WOVEN STRL DISP B (DISPOSABLE) IMPLANT
TUBE CONNECTING 20X1/4 (TUBING) ×2 IMPLANT
UNDERPAD 30X30 (UNDERPADS AND DIAPERS) ×2 IMPLANT

## 2016-01-10 NOTE — Interval H&P Note (Signed)
History and Physical Interval Note:  01/10/2016 7:40 AM  Megan Kidd  has presented today for surgery, with the diagnosis of lateral epicondylitis left elbow  The various methods of treatment have been discussed with the patient and family. After consideration of risks, benefits and other options for treatment, the patient has consented to  Procedure(s): TENNIS ELBOW RELEASE/debridement, left (Left) as a surgical intervention .  The patient's history has been reviewed, patient examined, no change in status, stable for surgery.  I have reviewed the patient's chart and labs.  Questions were answered to the patient's satisfaction.     Ninetta Lights

## 2016-01-10 NOTE — Anesthesia Procedure Notes (Signed)
Procedure Name: LMA Insertion Date/Time: 01/10/2016 8:46 AM Performed by: Tykisha Areola D Pre-anesthesia Checklist: Patient identified, Emergency Drugs available, Suction available and Patient being monitored Patient Re-evaluated:Patient Re-evaluated prior to inductionOxygen Delivery Method: Circle system utilized Preoxygenation: Pre-oxygenation with 100% oxygen Intubation Type: IV induction Ventilation: Mask ventilation without difficulty LMA: LMA inserted LMA Size: 4.0 Number of attempts: 1 Airway Equipment and Method: Bite block Placement Confirmation: positive ETCO2 Tube secured with: Tape Dental Injury: Teeth and Oropharynx as per pre-operative assessment

## 2016-01-10 NOTE — Discharge Instructions (Signed)
Do not remove splint.  Do not get splint wet.  Wear sling for comfort.  May apply ice for up to 20 minutes at a time for pain and swelling.  Follow up appointment in one week.  SEEK MEDICAL CARE IF: You have swelling of your calf or leg.  You develop shortness of breath or chest pain.  You have redness, swelling, or increasing pain in the wound.  There is pus or any unusual drainage coming from the surgical site.  You notice a bad smell coming from the surgical site or dressing.  The surgical site breaks open after sutures or staples have been removed.  There is persistent bleeding from the suture or staple line.  You are getting worse or are not improving.  You have any other questions or concerns.  SEEK IMMEDIATE MEDICAL CARE IF:  You have a fever.  You develop a rash.  You have difficulty breathing.  You develop any reaction or side effects to medicines given.  Your knee motion is decreasing rather than improving.  MAKE SURE YOU:  Understand these instructions.  Will watch your condition.  Will get help right away if you are not doing well or get worse.     Post Anesthesia Home Care Instructions  Activity: Get plenty of rest for the remainder of the day. A responsible adult should stay with you for 24 hours following the procedure.  For the next 24 hours, DO NOT: -Drive a car -Paediatric nurse -Drink alcoholic beverages -Take any medication unless instructed by your physician -Make any legal decisions or sign important papers.  Meals: Start with liquid foods such as gelatin or soup. Progress to regular foods as tolerated. Avoid greasy, spicy, heavy foods. If nausea and/or vomiting occur, drink only clear liquids until the nausea and/or vomiting subsides. Call your physician if vomiting continues.  Special Instructions/Symptoms: Your throat may feel dry or sore from the anesthesia or the breathing tube placed in your throat during surgery. If this causes discomfort, gargle  with warm salt water. The discomfort should disappear within 24 hours.  If you had a scopolamine patch placed behind your ear for the management of post- operative nausea and/or vomiting:  1. The medication in the patch is effective for 72 hours, after which it should be removed.  Wrap patch in a tissue and discard in the trash. Wash hands thoroughly with soap and water. 2. You may remove the patch earlier than 72 hours if you experience unpleasant side effects which may include dry mouth, dizziness or visual disturbances. 3. Avoid touching the patch. Wash your hands with soap and water after contact with the patch.

## 2016-01-10 NOTE — Anesthesia Postprocedure Evaluation (Signed)
Anesthesia Post Note  Patient: Zabdi Deberardinis  Procedure(s) Performed: Procedure(s) (LRB): LEFT TENNIS ELBOW RELEASE/DEBRIDEMENT (Left)  Patient location during evaluation: PACU Anesthesia Type: General Level of consciousness: awake and alert Pain management: pain level controlled Vital Signs Assessment: post-procedure vital signs reviewed and stable Respiratory status: spontaneous breathing, nonlabored ventilation, respiratory function stable and patient connected to nasal cannula oxygen Cardiovascular status: blood pressure returned to baseline and stable Postop Assessment: no signs of nausea or vomiting Anesthetic complications: no    Last Vitals:  Vitals:   01/10/16 1045 01/10/16 1124  BP: 125/78 123/76  Pulse: 70 60  Resp: 14 16  Temp:  36.8 C    Last Pain:  Vitals:   01/10/16 1115  TempSrc:   PainSc: [place] Georganne Siple

## 2016-01-10 NOTE — Transfer of Care (Signed)
Immediate Anesthesia Transfer of Care Note  Patient: Megan Kidd  Procedure(s) Performed: Procedure(s): LEFT TENNIS ELBOW RELEASE/DEBRIDEMENT (Left)  Patient Location: PACU  Anesthesia Type:General  Level of Consciousness: awake and patient cooperative  Airway & Oxygen Therapy: Patient Spontanous Breathing and Patient connected to face mask oxygen  Post-op Assessment: Report given to RN and Post -op Vital signs reviewed and stable  Post vital signs: Reviewed and stable  Last Vitals:  Vitals:   01/10/16 0759  BP: (!) 144/77  Pulse: 66  Resp: 18  Temp: 36.8 C    Last Pain:  Vitals:   01/10/16 0759  TempSrc: Oral  PainSc: 4          Complications: No apparent anesthesia complications

## 2016-01-11 ENCOUNTER — Encounter (HOSPITAL_BASED_OUTPATIENT_CLINIC_OR_DEPARTMENT_OTHER): Payer: Self-pay | Admitting: Orthopedic Surgery

## 2016-01-11 NOTE — Op Note (Signed)
NAME:  [name], Megan Kidd              ACCOUNT NO.:  [id number]  MEDICAL RECORD NO.:  [id number]  LOCATION:                                 FACILITY:  PHYSICIAN:  Ninetta Lights, M.D. DATE OF BIRTH:  June 22, 1955  DATE OF PROCEDURE: DATE OF DISCHARGE:                              OPERATIVE REPORT   PREOPERATIVE DIAGNOSIS:  Longstanding recalcitrant worsening lateral epicondylitis with tearing extensor carpi radialis brevis tendon, lateral aspect left elbow.  POSTOPERATIVE DIAGNOSIS:  Longstanding recalcitrant worsening lateral epicondylitis with tearing extensor carpi radialis brevis tendon, lateral aspect left elbow.  PROCEDURE:  Left elbow exploration.  Debridement of degenerative extensor carpi radialis brevis tendon.  Debridement and drilling of lateral epicondyle.  Primary repair of superficial extensors over the defect with Vicryl.  SURGEON:  Ninetta Lights, M.D.  ASSISTANT:  Elmyra Ricks, P.A.  ANESTHESIA:  General.  BLOOD LOSS:  Minimal.  SPECIMENS:  None.  CULTURES:  None.  COMPLICATIONS:  None.  DRESSINGS:  Soft compressive with a long-arm splint.  TOURNIQUET TIME:  40 minutes.  DESCRIPTION OF PROCEDURE:  The patient was brought to the operating room and after adequate anesthesia had been obtained, tourniquet applied, prepped and draped in usual sterile fashion.  Exsanguinated with elevation of Esmarch.  Tourniquet inflated to 250 mmHg.  Longitudinal incision lateral aspect left elbow from the epicondyle distally.  Skin and subcutaneous tissue divided.  Superficial extensors intact.  Divided longitudinally exposing ECRB tendon.  This had marked mucinous degeneration, proximal 1 cm also involving the capsule below.  All abnormal degenerative inflamed tissue excised.  The joint inspected. Some mild changes on the radial head, but not extensive.  No evidence of instability.  Epicondyle debrided, treated with multiple drilling. Wound irrigated.  I then  did a reapproximation repair closure of the superficial extensors over the defect.  Subcutaneous and subcuticular closure.  Margins were injected with Marcaine.  Sterile compressive dressing applied.  Tourniquet deflated and removed.  Long arm splint applied.  Anesthesia reversed.  Brought to the recovery room.  Tolerated the surgery well.  No complications.     Ninetta Lights, M.D.     DFM/MEDQ  D:  01/10/2016  T:  01/11/2016  Job:  [id number]

## 2016-01-22 DIAGNOSIS — M7712 Lateral epicondylitis, left elbow: Secondary | ICD-10-CM | POA: Diagnosis not present

## 2016-01-24 DIAGNOSIS — M25622 Stiffness of left elbow, not elsewhere classified: Secondary | ICD-10-CM | POA: Diagnosis not present

## 2016-01-24 DIAGNOSIS — M25522 Pain in left elbow: Secondary | ICD-10-CM | POA: Diagnosis not present

## 2016-02-15 DIAGNOSIS — M25522 Pain in left elbow: Secondary | ICD-10-CM | POA: Diagnosis not present

## 2016-04-22 DIAGNOSIS — M25522 Pain in left elbow: Secondary | ICD-10-CM | POA: Diagnosis not present

## 2016-04-22 DIAGNOSIS — R079 Chest pain, unspecified: Secondary | ICD-10-CM | POA: Diagnosis not present

## 2016-05-15 DIAGNOSIS — L814 Other melanin hyperpigmentation: Secondary | ICD-10-CM | POA: Diagnosis not present

## 2016-05-15 DIAGNOSIS — D1801 Hemangioma of skin and subcutaneous tissue: Secondary | ICD-10-CM | POA: Diagnosis not present

## 2016-05-15 DIAGNOSIS — L859 Epidermal thickening, unspecified: Secondary | ICD-10-CM | POA: Diagnosis not present

## 2016-05-15 DIAGNOSIS — L821 Other seborrheic keratosis: Secondary | ICD-10-CM | POA: Diagnosis not present

## 2016-05-19 DIAGNOSIS — M9903 Segmental and somatic dysfunction of lumbar region: Secondary | ICD-10-CM | POA: Diagnosis not present

## 2016-05-19 DIAGNOSIS — M5136 Other intervertebral disc degeneration, lumbar region: Secondary | ICD-10-CM | POA: Diagnosis not present

## 2016-05-19 DIAGNOSIS — M9905 Segmental and somatic dysfunction of pelvic region: Secondary | ICD-10-CM | POA: Diagnosis not present

## 2016-05-19 DIAGNOSIS — Q72812 Congenital shortening of left lower limb: Secondary | ICD-10-CM | POA: Diagnosis not present

## 2016-05-20 DIAGNOSIS — M9905 Segmental and somatic dysfunction of pelvic region: Secondary | ICD-10-CM | POA: Diagnosis not present

## 2016-05-20 DIAGNOSIS — M5136 Other intervertebral disc degeneration, lumbar region: Secondary | ICD-10-CM | POA: Diagnosis not present

## 2016-05-20 DIAGNOSIS — Q72812 Congenital shortening of left lower limb: Secondary | ICD-10-CM | POA: Diagnosis not present

## 2016-05-20 DIAGNOSIS — M9903 Segmental and somatic dysfunction of lumbar region: Secondary | ICD-10-CM | POA: Diagnosis not present

## 2016-05-21 DIAGNOSIS — M9903 Segmental and somatic dysfunction of lumbar region: Secondary | ICD-10-CM | POA: Diagnosis not present

## 2016-05-21 DIAGNOSIS — M5136 Other intervertebral disc degeneration, lumbar region: Secondary | ICD-10-CM | POA: Diagnosis not present

## 2016-05-21 DIAGNOSIS — Q72812 Congenital shortening of left lower limb: Secondary | ICD-10-CM | POA: Diagnosis not present

## 2016-05-21 DIAGNOSIS — M9905 Segmental and somatic dysfunction of pelvic region: Secondary | ICD-10-CM | POA: Diagnosis not present

## 2016-05-22 DIAGNOSIS — M9903 Segmental and somatic dysfunction of lumbar region: Secondary | ICD-10-CM | POA: Diagnosis not present

## 2016-05-22 DIAGNOSIS — M5136 Other intervertebral disc degeneration, lumbar region: Secondary | ICD-10-CM | POA: Diagnosis not present

## 2016-05-22 DIAGNOSIS — Q72812 Congenital shortening of left lower limb: Secondary | ICD-10-CM | POA: Diagnosis not present

## 2016-05-22 DIAGNOSIS — M9905 Segmental and somatic dysfunction of pelvic region: Secondary | ICD-10-CM | POA: Diagnosis not present

## 2016-05-26 DIAGNOSIS — M5136 Other intervertebral disc degeneration, lumbar region: Secondary | ICD-10-CM | POA: Diagnosis not present

## 2016-05-26 DIAGNOSIS — M9903 Segmental and somatic dysfunction of lumbar region: Secondary | ICD-10-CM | POA: Diagnosis not present

## 2016-05-26 DIAGNOSIS — M9905 Segmental and somatic dysfunction of pelvic region: Secondary | ICD-10-CM | POA: Diagnosis not present

## 2016-05-26 DIAGNOSIS — Q72812 Congenital shortening of left lower limb: Secondary | ICD-10-CM | POA: Diagnosis not present

## 2016-05-27 DIAGNOSIS — M9905 Segmental and somatic dysfunction of pelvic region: Secondary | ICD-10-CM | POA: Diagnosis not present

## 2016-05-27 DIAGNOSIS — M5136 Other intervertebral disc degeneration, lumbar region: Secondary | ICD-10-CM | POA: Diagnosis not present

## 2016-05-27 DIAGNOSIS — M9903 Segmental and somatic dysfunction of lumbar region: Secondary | ICD-10-CM | POA: Diagnosis not present

## 2016-05-27 DIAGNOSIS — Q72812 Congenital shortening of left lower limb: Secondary | ICD-10-CM | POA: Diagnosis not present

## 2016-05-29 DIAGNOSIS — M5136 Other intervertebral disc degeneration, lumbar region: Secondary | ICD-10-CM | POA: Diagnosis not present

## 2016-05-29 DIAGNOSIS — M9905 Segmental and somatic dysfunction of pelvic region: Secondary | ICD-10-CM | POA: Diagnosis not present

## 2016-05-29 DIAGNOSIS — M9903 Segmental and somatic dysfunction of lumbar region: Secondary | ICD-10-CM | POA: Diagnosis not present

## 2016-05-29 DIAGNOSIS — Q72812 Congenital shortening of left lower limb: Secondary | ICD-10-CM | POA: Diagnosis not present

## 2016-05-30 DIAGNOSIS — J069 Acute upper respiratory infection, unspecified: Secondary | ICD-10-CM | POA: Diagnosis not present

## 2016-06-18 ENCOUNTER — Ambulatory Visit: Payer: BLUE CROSS/BLUE SHIELD | Admitting: Nurse Practitioner

## 2016-07-29 ENCOUNTER — Encounter: Payer: Self-pay | Admitting: Nurse Practitioner

## 2016-07-29 ENCOUNTER — Ambulatory Visit (INDEPENDENT_AMBULATORY_CARE_PROVIDER_SITE_OTHER): Payer: BLUE CROSS/BLUE SHIELD | Admitting: Nurse Practitioner

## 2016-07-29 VITALS — BP 110/72 | HR 72 | Resp 16 | Ht 62.0 in | Wt 159.0 lb

## 2016-07-29 DIAGNOSIS — N952 Postmenopausal atrophic vaginitis: Secondary | ICD-10-CM

## 2016-07-29 DIAGNOSIS — N951 Menopausal and female climacteric states: Secondary | ICD-10-CM

## 2016-07-29 DIAGNOSIS — Z01411 Encounter for gynecological examination (general) (routine) with abnormal findings: Secondary | ICD-10-CM

## 2016-07-29 DIAGNOSIS — Z79890 Hormone replacement therapy (postmenopausal): Secondary | ICD-10-CM | POA: Diagnosis not present

## 2016-07-29 MED ORDER — ESTRADIOL 10 MCG VA TABS
ORAL_TABLET | VAGINAL | 4 refills | Status: DC
Start: 2016-07-29 — End: 2017-08-13

## 2016-07-29 MED ORDER — ESTRADIOL 1 MG PO TABS: 0.5000 mg | ORAL_TABLET | Freq: Every day | ORAL | 4 refills | Status: DC

## 2016-07-29 MED ORDER — NITROFURANTOIN MONOHYD MACRO 100 MG PO CAPS: 100.0000 mg | ORAL_CAPSULE | ORAL | 4 refills | Status: DC | PRN

## 2016-07-29 MED ORDER — ESTROGENS, CONJUGATED 0.625 MG/GM VA CREA: TOPICAL_CREAM | VAGINAL | 4 refills | Status: DC

## 2016-07-29 NOTE — Progress Notes (Signed)
61 y.o. G36P2002 Married  Caucasian Fe here for annual exam.  Pt remains of ERT and doing well.  Wants to continue.  She is using the pea size of Premarin to the urethra and that is helping to reduce post coital UTI along with Macrobid.  Occasional vaso symptoms but they are tolerable at 1/2 tablet.   Still having problems with pain right rib cage.  She has seen PCP and chiropractor.  She has right knee pain since 3 weeks ago. Right elbow surgery 11/17.  Seems to be having more and more joint and tendon problems. They continue to work on building their home in [place].  Her daughter who had a pregnancy last year with baby that had no brian above the brain stem did deliver the baby at 32 weeks.  After that pregnancy; then daughter took Clomid and is pregnant again at 22 weeks due 12/01/16.   So far everything looks good and normal.  Patient's last menstrual period was 06/25/1993.          Sexually active: Yes.    The current method of family planning is status post hysterectomy.    Exercising: No.  The patient does not participate in regular exercise at present. Smoker:  no  Health Maintenance: Pap: 07/30/1998 Neg History of Abnormal Pap: no MMG:  06/25/15 BIRADS1, Density B, [facility] Self Breast exams: yes Colonoscopy:   2017 Colitis - repeat 10 years will get report from Dr. Collene Mares BMD:  04/02/15 Osteopenia of hip -1.4 TDaP:  06/18/15 Shingles: 2015 Hep C and HIV: 06/18/15 Neg Labs: PCP   reports that she has never smoked. She has never used smokeless tobacco. She reports that she drinks alcohol. She reports that she does not use drugs.  Past Medical History:  Diagnosis Date  . Anxiety   . Complication of anesthesia    hard to wake from general anesthesia  . Contact lens/glasses fitting    wears contacts or glasses  . Depression   . Endometriosis   . Fibroids   . Headache   . Recurrent UTI     Past Surgical History:  Procedure Laterality Date  . ABDOMINAL HYSTERECTOMY  06/25/93   secondary to endometriosis  . APPENDECTOMY  1995  . BUNIONECTOMY Left 04/2011  . CARPAL TUNNEL RELEASE Right 07/07/2013   Procedure: RIGHT CARPAL TUNNEL RELEASE;  Surgeon: Cammie Sickle., MD;  Location: ;  Service: Orthopedics;  Laterality: Right;  . CESAREAN SECTION     x2  . COLONOSCOPY W/ BIOPSIES  06/25/2005   colitis - recheck in 10 years  . DILATION AND CURETTAGE OF UTERUS  12/92   Hysteroscopy  . HYSTEROSCOPY  12/92  . SHOULDER ARTHROSCOPY Right 10/15/2000   with debridement, DCE and manipulation  . SHOULDER SURGERY     2202-2005 bilateral frozen shoulders  . TENNIS ELBOW RELEASE/NIRSCHEL PROCEDURE Left 01/10/2016   Procedure: LEFT TENNIS ELBOW RELEASE/DEBRIDEMENT;  Surgeon: Ninetta Lights, MD;  Location: [facility];  Service: Orthopedics;  Laterality: Left;  . torn tendon     right elbow    Current Outpatient Prescriptions  Medication Sig Dispense Refill  . ALPRAZolam (XANAX) 0.25 MG tablet Take 1 tablet by mouth 2 (two) times daily as needed.   2  . Ascorbic Acid (VITAMIN C PO) Take by mouth.    Marland Kitchen aspirin 81 MG tablet Take 81 mg by mouth daily.    . B Complex Vitamins (B COMPLEX PO) Take by mouth.    Marland Kitchen  Biotin 10 MG TABS Take 1 tablet by mouth daily.    Marland Kitchen buPROPion (WELLBUTRIN XL) 150 MG 24 hr tablet Take 450 mg by mouth daily.     . cholecalciferol (VITAMIN D) 1000 UNITS tablet Take 1,000 Units by mouth daily.    Marland Kitchen conjugated estrogens (PREMARIN) vaginal cream Apply a pea sized amount at the urethra prn. 30 g 4  . CRANBERRY PO Take by mouth.    . estradiol (ESTRACE) 1 MG tablet Take 0.5 tablets (0.5 mg total) by mouth daily. 90 tablet 4  . MAGNESIUM PO Take by mouth.    . Naproxen-Esomeprazole (VIMOVO) 375-20 MG TBEC Take by mouth.    . nitrofurantoin, macrocrystal-monohydrate, (MACROBID) 100 MG capsule Take 1 capsule (100 mg total) by mouth as needed (after intercourse). 30 capsule 4  . Omega-3 Fatty Acids (FISH OIL PO) Take by  mouth.    . Probiotic Product (PROBIOTIC PO) Take by mouth.    . Vilazodone HCl (VIIBRYD) 40 MG TABS Take 40 mg by mouth daily.     . Estradiol (VAGIFEM) 10 MCG TABS vaginal tablet Use twice a week 24 tablet 4   No current facility-administered medications for this visit.     Family History  Problem Relation Age of Onset  . Hypertension Mother   . Osteoarthritis Mother   . Rheum arthritis Mother   . Heart failure Father   . Hypertension Father   . Lung cancer Father 72  . COPD Father   . Depression Father   . Heart disease Father        CABG X 3 in 1990 mid 60's  . Hypertension Maternal Grandfather   . Stroke Maternal Grandfather   . Parkinson's disease Maternal Grandmother   . Depression Paternal Grandmother   . Heart failure Paternal Grandfather   . Depression Sister        X 2  . Heart failure Paternal Uncle   . Lung cancer Paternal Uncle   . Heart failure Paternal Uncle     ROS:  Pertinent items are noted in HPI.  Otherwise, a comprehensive ROS was negative.  Exam:   BP 110/72 (BP Location: Right Arm, Patient Position: Sitting, Cuff Size: Normal)   Pulse 72   Resp 16   Ht 5\' 2"  (1.575 m)   Wt 159 lb (72.1 kg)   LMP 06/25/1993   BMI 29.08 kg/m  Height: 5\' 2"  (157.5 cm) Ht Readings from Last 3 Encounters:  07/29/16 5\' 2"  (1.575 m)  01/10/16 5' 2.5" (1.588 m)  12/25/15 5' 2.25" (1.581 m)    General appearance: alert, cooperative and appears stated age Head: Normocephalic, without obvious abnormality, atraumatic Neck: no adenopathy, supple, symmetrical, trachea midline and thyroid normal to inspection and palpation Lungs: clear to auscultation bilaterally Breasts: normal appearance, no masses or tenderness Heart: regular rate and rhythm Abdomen: soft, non-tender; no masses,  no organomegaly Extremities: extremities normal, atraumatic, no cyanosis or edema Skin: Skin color, texture, turgor normal. No rashes or lesions Lymph nodes: Cervical, supraclavicular,  and axillary nodes normal. No abnormal inguinal nodes palpated Neurologic: Grossly normal   Pelvic: External genitalia:  no lesions              Urethra:  normal appearing urethra with no masses, tenderness or lesions              Bartholin's and Skene's: normal                 Vagina: normal appearing vagina  with normal color and discharge, no lesions              Cervix: absent              Pap taken: No. Bimanual Exam:  Uterus:  uterus absent              Adnexa: no mass, fullness, tenderness               Rectovaginal: Confirms               Anus:  normal sphincter tone, no lesions  Chaperone present: yes  A:  Well Woman with normal exam  S/P TAH 5/95 secondary to endometriosis on ERT  Osteopenia, depression, anxiety  Atrophic vaginitis and post coital UTI   P:   Reviewed health and wellness pertinent to exam  Pap smear: no  Mammogram is due now and will schedule - just [place] the country a [place] days ago.  Refill on Estradiol 1 mg, Vagifem twice a week; Premarin to the urethra; and Macrobid to use PC.   Counseled about risk of DVT, CVA, cancer, etc.  Counseled on breast self exam, mammography screening, use and side effects of HRT, adequate intake of calcium and vitamin D, diet and exercise, Kegel's exercises return annually or prn  An After Visit Summary was printed and given to the patient.

## 2016-07-29 NOTE — Patient Instructions (Signed)

## 2016-07-30 NOTE — Progress Notes (Signed)
Reviewed personally.  M. Suzanne Ndidi Nesby, MD.  

## 2016-07-31 DIAGNOSIS — L259 Unspecified contact dermatitis, unspecified cause: Secondary | ICD-10-CM | POA: Diagnosis not present

## 2016-07-31 DIAGNOSIS — R079 Chest pain, unspecified: Secondary | ICD-10-CM | POA: Diagnosis not present

## 2016-07-31 DIAGNOSIS — F419 Anxiety disorder, unspecified: Secondary | ICD-10-CM | POA: Diagnosis not present

## 2016-07-31 DIAGNOSIS — M25561 Pain in right knee: Secondary | ICD-10-CM | POA: Diagnosis not present

## 2016-08-11 DIAGNOSIS — H1045 Other chronic allergic conjunctivitis: Secondary | ICD-10-CM | POA: Diagnosis not present

## 2016-08-20 DIAGNOSIS — L309 Dermatitis, unspecified: Secondary | ICD-10-CM | POA: Diagnosis not present

## 2016-08-20 DIAGNOSIS — L659 Nonscarring hair loss, unspecified: Secondary | ICD-10-CM | POA: Diagnosis not present

## 2016-09-30 ENCOUNTER — Telehealth: Payer: Self-pay | Admitting: Obstetrics & Gynecology

## 2016-09-30 NOTE — Telephone Encounter (Signed)
LMTCB/:NP/ .CX/LETTER SENT/RD

## 2016-10-14 DIAGNOSIS — L659 Nonscarring hair loss, unspecified: Secondary | ICD-10-CM | POA: Diagnosis not present

## 2016-10-23 DIAGNOSIS — L650 Telogen effluvium: Secondary | ICD-10-CM | POA: Diagnosis not present

## 2016-10-28 DIAGNOSIS — R946 Abnormal results of thyroid function studies: Secondary | ICD-10-CM | POA: Diagnosis not present

## 2016-10-28 DIAGNOSIS — Z23 Encounter for immunization: Secondary | ICD-10-CM | POA: Diagnosis not present

## 2016-11-06 DIAGNOSIS — L650 Telogen effluvium: Secondary | ICD-10-CM | POA: Diagnosis not present

## 2016-11-11 ENCOUNTER — Telehealth: Payer: Self-pay | Admitting: Family Medicine

## 2016-11-11 NOTE — Telephone Encounter (Signed)
A lady called (did not give her name) stating that she was upset that she had to be placed on hold for 20 min before getting to someone. (the receptionist was checking in patients however, it was not 20 min) She did not have the D.O.B for the patient and was driving home where she could locate the D.O.B. I placed the call on hold and checked in a patient then returned to the call to see what we could do for her. She stated that Endocrinology told her the patient can not come to the office, I did not see record of a telephone note in epic. I transferred the call to Mardene Celeste at Endo to help who had to place the call on hold due to having a patient.

## 2016-12-25 DIAGNOSIS — L308 Other specified dermatitis: Secondary | ICD-10-CM | POA: Diagnosis not present

## 2016-12-25 DIAGNOSIS — L658 Other specified nonscarring hair loss: Secondary | ICD-10-CM | POA: Diagnosis not present

## 2017-01-23 DIAGNOSIS — M25561 Pain in right knee: Secondary | ICD-10-CM | POA: Diagnosis not present

## 2017-01-26 DIAGNOSIS — M25561 Pain in right knee: Secondary | ICD-10-CM | POA: Diagnosis not present

## 2017-01-27 DIAGNOSIS — M25561 Pain in right knee: Secondary | ICD-10-CM | POA: Diagnosis not present

## 2017-01-27 NOTE — H&P (Signed)
Megan Kidd comes in for her right knee.  Initially hurt this getting out of a beach chair awkwardly.  Vertical load twisting injury.  Immediate pain on the medial aspect.  It settled down somewhat, but she has had two more episodes of re-injury, most recently in November.  [place]  Vertical load twist.  Excruciating medial pain.  Persistent mechanical symptoms since.  It is a little bit better, but now feels unsteady with marked pain.  She cannot squat or go up and down stairs.  Uneven ground troubling.  No symptoms before that event in May.   History and general exam are reviewed.  She is seen with her husband.    EXAMINATION: Healthy 61 year-old female.  A little bit of an antalgic gait on the right.  Both knees have full motion.  Good stability.  I feel like I am getting a good end point on the ACL and MCL on the right.  Exquisitely tender medial compartment.  Positive medial McMurray's.  Good patella tracking.  Minimal crepitus.  Neurovascularly intact distally.  X-RAYS: I have looked at her films after the initial trauma, as well as a four view standing film today.  No joint space narrowing.  No acute bony abnormalities.  IMPRESSION: Medial meniscus tear, right knee.  PLAN: MRI to define pathology.  We have discussed definitive treatment with arthroscopy.  Follow up with her right after her MRI to proceed with definitive treatment depending on findings.  I went over this in detail with her and her husband and they understand.    Addendum: MRI of the right knee reveals a medial meniscus tear as well as degenerative joint disease.  We will proceed with right knee arthroscopic debridement medial meniscus and chondroplasty.  Risks, benefits and possible complications reviewed.  Rehab and recovery time discussed.

## 2017-01-28 ENCOUNTER — Encounter (HOSPITAL_BASED_OUTPATIENT_CLINIC_OR_DEPARTMENT_OTHER): Payer: Self-pay | Admitting: *Deleted

## 2017-01-28 ENCOUNTER — Other Ambulatory Visit: Payer: Self-pay

## 2017-01-30 ENCOUNTER — Other Ambulatory Visit: Payer: Self-pay

## 2017-01-30 ENCOUNTER — Ambulatory Visit (HOSPITAL_BASED_OUTPATIENT_CLINIC_OR_DEPARTMENT_OTHER): Payer: BLUE CROSS/BLUE SHIELD | Admitting: Anesthesiology

## 2017-01-30 ENCOUNTER — Ambulatory Visit (HOSPITAL_BASED_OUTPATIENT_CLINIC_OR_DEPARTMENT_OTHER)
Admission: RE | Admit: 2017-01-30 | Discharge: 2017-01-30 | Disposition: A | Discharge status: Home / Self Care | Payer: BLUE CROSS/BLUE SHIELD | Source: Ambulatory Visit | Attending: Orthopedic Surgery | Admitting: Orthopedic Surgery

## 2017-01-30 ENCOUNTER — Encounter (HOSPITAL_BASED_OUTPATIENT_CLINIC_OR_DEPARTMENT_OTHER): Payer: Self-pay | Admitting: Anesthesiology

## 2017-01-30 ENCOUNTER — Encounter (HOSPITAL_BASED_OUTPATIENT_CLINIC_OR_DEPARTMENT_OTHER)
Admission: RE | Disposition: A | Discharge status: Home / Self Care | Payer: Self-pay | Source: Ambulatory Visit | Attending: Orthopedic Surgery

## 2017-01-30 DIAGNOSIS — F418 Other specified anxiety disorders: Secondary | ICD-10-CM | POA: Diagnosis not present

## 2017-01-30 DIAGNOSIS — M1711 Unilateral primary osteoarthritis, right knee: Secondary | ICD-10-CM | POA: Diagnosis not present

## 2017-01-30 DIAGNOSIS — Z79899 Other long term (current) drug therapy: Secondary | ICD-10-CM | POA: Insufficient documentation

## 2017-01-30 DIAGNOSIS — S83241A Other tear of medial meniscus, current injury, right knee, initial encounter: Secondary | ICD-10-CM | POA: Insufficient documentation

## 2017-01-30 DIAGNOSIS — X501XXA Overexertion from prolonged static or awkward postures, initial encounter: Secondary | ICD-10-CM | POA: Diagnosis not present

## 2017-01-30 DIAGNOSIS — Z791 Long term (current) use of non-steroidal anti-inflammatories (NSAID): Secondary | ICD-10-CM | POA: Diagnosis not present

## 2017-01-30 DIAGNOSIS — Z79890 Hormone replacement therapy (postmenopausal): Secondary | ICD-10-CM | POA: Diagnosis not present

## 2017-01-30 DIAGNOSIS — M2241 Chondromalacia patellae, right knee: Secondary | ICD-10-CM | POA: Diagnosis not present

## 2017-01-30 HISTORY — DX: Other tear of medial meniscus, current injury, unspecified knee, initial encounter: S83.249A

## 2017-01-30 HISTORY — PX: KNEE ARTHROSCOPY WITH MEDIAL MENISECTOMY: SHX5651

## 2017-01-30 HISTORY — PX: CHONDROPLASTY: SHX5177

## 2017-01-30 SURGERY — ARTHROSCOPY, KNEE, WITH MEDIAL MENISCECTOMY
Anesthesia: General | Site: Knee | Laterality: Right

## 2017-01-30 MED ORDER — EPHEDRINE SULFATE 50 MG/ML IJ SOLN
INTRAMUSCULAR | Status: DC | PRN
  Administered 2017-01-30: 10 mg via INTRAVENOUS

## 2017-01-30 MED ORDER — MIDAZOLAM HCL 2 MG/2ML IJ SOLN: 1.0000 mg | INTRAMUSCULAR | Status: DC | PRN

## 2017-01-30 MED ORDER — METHYLPREDNISOLONE ACETATE 80 MG/ML IJ SUSP
INTRAMUSCULAR | Status: AC
  Filled 2017-01-30: qty 1

## 2017-01-30 MED ORDER — PROPOFOL 10 MG/ML IV BOLUS
INTRAVENOUS | Status: AC
  Filled 2017-01-30: qty 20

## 2017-01-30 MED ORDER — EPHEDRINE 5 MG/ML INJ
INTRAVENOUS | Status: AC
  Filled 2017-01-30: qty 10

## 2017-01-30 MED ORDER — BUPIVACAINE HCL (PF) 0.5 % IJ SOLN
INTRAMUSCULAR | Status: DC | PRN
  Administered 2017-01-30: 19 mL

## 2017-01-30 MED ORDER — FENTANYL CITRATE (PF) 100 MCG/2ML IJ SOLN: 50.0000 ug | INTRAMUSCULAR | Status: DC | PRN

## 2017-01-30 MED ORDER — FENTANYL CITRATE (PF) 100 MCG/2ML IJ SOLN
INTRAMUSCULAR | Status: DC | PRN
  Administered 2017-01-30: 100 ug via INTRAVENOUS
  Administered 2017-01-30: 25 ug via INTRAVENOUS

## 2017-01-30 MED ORDER — MIDAZOLAM HCL 2 MG/2ML IJ SOLN
INTRAMUSCULAR | Status: AC
  Filled 2017-01-30: qty 2

## 2017-01-30 MED ORDER — METHYLPREDNISOLONE ACETATE 80 MG/ML IJ SUSP
INTRAMUSCULAR | Status: DC | PRN
  Administered 2017-01-30: 80 mg via INTRA_ARTICULAR

## 2017-01-30 MED ORDER — DEXAMETHASONE SODIUM PHOSPHATE 4 MG/ML IJ SOLN
INTRAMUSCULAR | Status: DC | PRN
  Administered 2017-01-30: 10 mg via INTRAVENOUS

## 2017-01-30 MED ORDER — PROMETHAZINE HCL 25 MG/ML IJ SOLN: 6.2500 mg | INTRAMUSCULAR | Status: DC | PRN

## 2017-01-30 MED ORDER — SCOPOLAMINE 1 MG/3DAYS TD PT72: 1.0000 | MEDICATED_PATCH | Freq: Once | TRANSDERMAL | Status: DC | PRN

## 2017-01-30 MED ORDER — CHLORHEXIDINE GLUCONATE 4 % EX LIQD: 60.0000 mL | Freq: Once | CUTANEOUS | Status: DC

## 2017-01-30 MED ORDER — HYDROMORPHONE HCL 1 MG/ML IJ SOLN
INTRAMUSCULAR | Status: AC
  Filled 2017-01-30: qty 0.5

## 2017-01-30 MED ORDER — VANCOMYCIN HCL IN DEXTROSE 1-5 GM/200ML-% IV SOLN
1000.0000 mg | INTRAVENOUS | Status: AC
  Administered 2017-01-30: 1000 mg via INTRAVENOUS

## 2017-01-30 MED ORDER — FENTANYL CITRATE (PF) 100 MCG/2ML IJ SOLN
INTRAMUSCULAR | Status: AC
  Filled 2017-01-30: qty 2

## 2017-01-30 MED ORDER — ONDANSETRON HCL 4 MG/2ML IJ SOLN
INTRAMUSCULAR | Status: DC | PRN
  Administered 2017-01-30: 4 mg via INTRAVENOUS

## 2017-01-30 MED ORDER — LACTATED RINGERS IV SOLN
INTRAVENOUS | Status: DC
  Administered 2017-01-30: 13:00:00 via INTRAVENOUS

## 2017-01-30 MED ORDER — BUPIVACAINE HCL (PF) 0.5 % IJ SOLN
INTRAMUSCULAR | Status: AC
  Filled 2017-01-30: qty 30

## 2017-01-30 MED ORDER — HYDROMORPHONE HCL 1 MG/ML IJ SOLN
0.2500 mg | INTRAMUSCULAR | Status: DC | PRN
  Administered 2017-01-30: 0.5 mg via INTRAVENOUS

## 2017-01-30 MED ORDER — SODIUM CHLORIDE 0.9 % IR SOLN
Status: DC | PRN
  Administered 2017-01-30: 3000 mL

## 2017-01-30 MED ORDER — VANCOMYCIN HCL IN DEXTROSE 1-5 GM/200ML-% IV SOLN
INTRAVENOUS | Status: AC
  Filled 2017-01-30: qty 200

## 2017-01-30 MED ORDER — KETOROLAC TROMETHAMINE 30 MG/ML IJ SOLN
INTRAMUSCULAR | Status: AC
  Filled 2017-01-30: qty 1

## 2017-01-30 MED ORDER — PHENYLEPHRINE 40 MCG/ML (10ML) SYRINGE FOR IV PUSH (FOR BLOOD PRESSURE SUPPORT)
PREFILLED_SYRINGE | INTRAVENOUS | Status: AC
  Filled 2017-01-30: qty 10

## 2017-01-30 MED ORDER — MIDAZOLAM HCL 5 MG/5ML IJ SOLN
INTRAMUSCULAR | Status: DC | PRN
  Administered 2017-01-30 (×2): 2 mg via INTRAVENOUS

## 2017-01-30 MED ORDER — LACTATED RINGERS IV SOLN
INTRAVENOUS | Status: DC
  Administered 2017-01-30 (×2): via INTRAVENOUS

## 2017-01-30 MED ORDER — PROPOFOL 10 MG/ML IV BOLUS
INTRAVENOUS | Status: DC | PRN
  Administered 2017-01-30: 200 mg via INTRAVENOUS

## 2017-01-30 MED ORDER — LIDOCAINE HCL (CARDIAC) 20 MG/ML IV SOLN
INTRAVENOUS | Status: DC | PRN
  Administered 2017-01-30: 30 mg via INTRAVENOUS

## 2017-01-30 MED ORDER — KETOROLAC TROMETHAMINE 30 MG/ML IJ SOLN
30.0000 mg | Freq: Once | INTRAMUSCULAR | Status: DC | PRN
  Administered 2017-01-30: 30 mg via INTRAVENOUS

## 2017-01-30 MED ORDER — GLYCOPYRROLATE 0.2 MG/ML IJ SOLN
INTRAMUSCULAR | Status: DC | PRN
  Administered 2017-01-30 (×2): 0.1 mg via INTRAVENOUS

## 2017-01-30 SURGICAL SUPPLY — 37 items
BANDAGE ACE 6X5 VEL STRL LF (GAUZE/BANDAGES/DRESSINGS) ×2 IMPLANT
BLADE CUDA 5.5 (BLADE) IMPLANT
BLADE CUDA GRT WHITE 3.5 (BLADE) IMPLANT
BLADE CUTTER GATOR 3.5 (BLADE) ×2 IMPLANT
BLADE CUTTER MENIS 5.5 (BLADE) IMPLANT
BLADE GREAT WHITE 4.2 (BLADE) ×2 IMPLANT
CUTTER MENISCUS  4.2MM (BLADE)
CUTTER MENISCUS 4.2MM (BLADE) IMPLANT
DRAPE ARTHROSCOPY W/POUCH 90 (DRAPES) ×2 IMPLANT
DURAPREP 26ML APPLICATOR (WOUND CARE) ×2 IMPLANT
ELECT MENISCUS 165MM 90D (ELECTRODE) IMPLANT
ELECT REM PT RETURN 9FT ADLT (ELECTROSURGICAL)
ELECTRODE REM PT RTRN 9FT ADLT (ELECTROSURGICAL) IMPLANT
GAUZE SPONGE 4X4 12PLY STRL (GAUZE/BANDAGES/DRESSINGS) ×2 IMPLANT
GAUZE XEROFORM 1X8 LF (GAUZE/BANDAGES/DRESSINGS) ×2 IMPLANT
GLOVE BIO SURGEON STRL SZ7 (GLOVE) ×2 IMPLANT
GLOVE BIOGEL PI IND STRL 7.0 (GLOVE) ×2 IMPLANT
GLOVE BIOGEL PI INDICATOR 7.0 (GLOVE) ×2
GLOVE ECLIPSE 7.0 STRL STRAW (GLOVE) ×2 IMPLANT
GLOVE SURG ORTHO 8.0 STRL STRW (GLOVE) ×2 IMPLANT
GOWN STRL REUS W/ TWL LRG LVL3 (GOWN DISPOSABLE) ×1 IMPLANT
GOWN STRL REUS W/ TWL XL LVL3 (GOWN DISPOSABLE) ×3 IMPLANT
GOWN STRL REUS W/TWL LRG LVL3 (GOWN DISPOSABLE) ×1
GOWN STRL REUS W/TWL XL LVL3 (GOWN DISPOSABLE) ×3
HOLDER KNEE FOAM BLUE (MISCELLANEOUS) ×2 IMPLANT
IV NS IRRIG 3000ML ARTHROMATIC (IV SOLUTION) ×4 IMPLANT
KNEE WRAP E Z 3 GEL PACK (MISCELLANEOUS) ×2 IMPLANT
MANIFOLD NEPTUNE II (INSTRUMENTS) ×2 IMPLANT
PACK ARTHROSCOPY DSU (CUSTOM PROCEDURE TRAY) ×2 IMPLANT
PACK BASIN DAY SURGERY FS (CUSTOM PROCEDURE TRAY) ×2 IMPLANT
PENCIL BUTTON HOLSTER BLD 10FT (ELECTRODE) IMPLANT
SET ARTHROSCOPY TUBING (MISCELLANEOUS) ×1
SET ARTHROSCOPY TUBING LN (MISCELLANEOUS) ×1 IMPLANT
SUT ETHILON 3 0 PS 1 (SUTURE) ×2 IMPLANT
SUT VIC AB 3-0 FS2 27 (SUTURE) IMPLANT
TOWEL OR 17X24 6PK STRL BLUE (TOWEL DISPOSABLE) ×2 IMPLANT
WATER STERILE IRR 1000ML POUR (IV SOLUTION) ×2 IMPLANT

## 2017-01-30 NOTE — Anesthesia Procedure Notes (Signed)
Procedure Name: LMA Insertion Date/Time: 01/30/2017 2:29 PM Performed by: Marrianne Mood, CRNA Pre-anesthesia Checklist: Patient identified, Emergency Drugs available, Suction available, Patient being monitored and Timeout performed Patient Re-evaluated:Patient Re-evaluated prior to induction Oxygen Delivery Method: Circle system utilized Preoxygenation: Pre-oxygenation with 100% oxygen Induction Type: IV induction Ventilation: Mask ventilation without difficulty LMA: LMA inserted LMA Size: 4.0 Number of attempts: 1 Airway Equipment and Method: Bite block Placement Confirmation: positive ETCO2 Tube secured with: Tape Dental Injury: Teeth and Oropharynx as per pre-operative assessment

## 2017-01-30 NOTE — Transfer of Care (Signed)
Immediate Anesthesia Transfer of Care Note  Patient: Megan Kidd  Procedure(s) Performed: RIGHT KNEE ARTHROSCOPY WITH MEDIAL MENISECTOMY, CHONDROPLASTY (Right Knee)  Patient Location: PACU  Anesthesia Type:General  Level of Consciousness: awake  Airway & Oxygen Therapy: Patient Spontanous Breathing and Patient connected to face mask oxygen  Post-op Assessment: Report given to RN and Post -op Vital signs reviewed and stable  Post vital signs: Reviewed and stable  Last Vitals:  Vitals:   01/30/17 1216  BP: (!) 141/77  Pulse: (!) 55  Resp: 18  Temp: 36.7 C  SpO2: 100%    Last Pain:  Vitals:   01/30/17 1216  TempSrc: Oral  PainSc: 5       Patients Stated Pain Goal: 2 (58/09/98 3382)  Complications: No apparent anesthesia complications

## 2017-01-30 NOTE — Anesthesia Preprocedure Evaluation (Signed)
Anesthesia Evaluation  Patient identified by MRN, date of birth, ID band Patient awake    Reviewed: Allergy & Precautions, NPO status , Patient's Chart, lab work & pertinent test results  Airway Mallampati: II  TM Distance: >3 FB Neck ROM: Full    Dental  (+) Dental Advisory Given   Pulmonary neg pulmonary ROS,    breath sounds clear to auscultation       Cardiovascular negative cardio ROS   Rhythm:Regular Rate:Normal     Neuro/Psych Anxiety Depression negative neurological ROS     GI/Hepatic negative GI ROS, Neg liver ROS,   Endo/Other  negative endocrine ROS  Renal/GU negative Renal ROS     Musculoskeletal   Abdominal   Peds  Hematology negative hematology ROS (+)   Anesthesia Other Findings   Reproductive/Obstetrics                             Anesthesia Physical Anesthesia Plan  ASA: I  Anesthesia Plan: General   Post-op Pain Management:    Induction: Intravenous  PONV Risk Score and Plan: 3 and Ondansetron, Dexamethasone, Midazolam and Treatment may vary due to age or medical condition  Airway Management Planned: LMA  Additional Equipment:   Intra-op Plan:   Post-operative Plan: Extubation in OR  Informed Consent: I have reviewed the patients History and Physical, chart, labs and discussed the procedure including the risks, benefits and alternatives for the proposed anesthesia with the patient or authorized representative who has indicated his/her understanding and acceptance.     Plan Discussed with:   Anesthesia Plan Comments:         Anesthesia Quick Evaluation

## 2017-01-30 NOTE — Discharge Instructions (Signed)
°  Post Anesthesia Home Care Instructions ° °Activity: °Get plenty of rest for the remainder of the day. A responsible individual must stay with you for 24 hours following the procedure.  °For the next 24 hours, DO NOT: °-Drive a car °-Operate machinery °-Drink alcoholic beverages °-Take any medication unless instructed by your physician °-Make any legal decisions or sign important papers. ° °Meals: °Start with liquid foods such as gelatin or soup. Progress to regular foods as tolerated. Avoid greasy, spicy, heavy foods. If nausea and/or vomiting occur, drink only clear liquids until the nausea and/or vomiting subsides. Call your physician if vomiting continues. ° °Special Instructions/Symptoms: °Your throat may feel dry or sore from the anesthesia or the breathing tube placed in your throat during surgery. If this causes discomfort, gargle with warm salt water. The discomfort should disappear within 24 hours. ° °If you had a scopolamine patch placed behind your ear for the management of post- operative nausea and/or vomiting: ° °1. The medication in the patch is effective for 72 hours, after which it should be removed.  Wrap patch in a tissue and discard in the trash. Wash hands thoroughly with soap and water. °2. You may remove the patch earlier than 72 hours if you experience unpleasant side effects which may include dry mouth, dizziness or visual disturbances. °3. Avoid touching the patch. Wash your hands with soap and water after contact with the patch. °  °Discharge Instructions after Knee Arthroscopy ° ° °You will have a light dressing on your knee.  °Leave the dressing in place until the third day after your surgery and then remove it and place a band-aid over the stitches.  °After the bandage has been removed you may shower, but do not soak the incision. °You may begin gentle motion of your leg immediately after surgery. °Pump your foot up and down 20 times per hour, every hour you are awake.  °Apply ice to  the knee 3 times per day for 30 minutes for the first 1 week until your knee is feeling comfortable again. Do not use heat.  °You may begin straight leg raising exercises (if you have a brace with it on). While lying down, pull your foot all the way up, tighten your quadriceps muscle and lift your heel off of the ground. Hold this position for 2 seconds, and then let the leg back down. Repeat the exercise 10 times, at least 3 times a day.  °Pain medicine has been prescribed for you.  °Use your medicine as needed over the first 48 hours, and then you can begin to taper your use. You may take Extra Strength Tylenol or Tylenol only in place of the pain pills.  ° ° °Please call [phone] for any problems. Including the following: ° °- excessive redness of the incisions °- drainage for more than 4 days °- fever of more than 101.5 F ° °*Please note that pain medications will not be refilled after hours or on weekends. ° ° °

## 2017-01-30 NOTE — Anesthesia Postprocedure Evaluation (Signed)
Anesthesia Post Note  Patient: Megan Kidd  Procedure(s) Performed: RIGHT KNEE ARTHROSCOPY WITH MEDIAL MENISECTOMY (Right Knee) CHONDROPLASTY (Right Knee)     Patient location during evaluation: PACU Anesthesia Type: General Level of consciousness: awake and alert Pain management: pain level controlled Vital Signs Assessment: post-procedure vital signs reviewed and stable Respiratory status: spontaneous breathing, nonlabored ventilation, respiratory function stable and patient connected to nasal cannula oxygen Cardiovascular status: blood pressure returned to baseline and stable Postop Assessment: no apparent nausea or vomiting Anesthetic complications: no    Last Vitals:  Vitals:   01/30/17 1630 01/30/17 1651  BP: 126/86 136/74  Pulse: 67 72  Resp: 17 16  Temp:  36.7 C  SpO2: 99% 98%    Last Pain:  Vitals:   01/30/17 1651  TempSrc: Oral  PainSc: 2                  Tiajuana Amass

## 2017-01-30 NOTE — Interval H&P Note (Signed)
History and Physical Interval Note:  01/30/2017 12:45 PM  [name]  has presented today for surgery, with the diagnosis of RIGHT KNEE ARTHROSCOPY WITH MEDIAL MENISCUS  The various methods of treatment have been discussed with the patient and family. After consideration of risks, benefits and other options for treatment, the patient has consented to  Procedure(s): RIGHT KNEE ARTHROSCOPY WITH MEDIAL MENISECTOMY,CHONDROPLASTY (Right) as a surgical intervention .  The patient's history has been reviewed, patient examined, no change in status, stable for surgery.  I have reviewed the patient's chart and labs.  Questions were answered to the patient's satisfaction.     Megan Kidd

## 2017-02-01 NOTE — Op Note (Signed)
Megan Kidd, Megan Kidd              ACCOUNT NO.:  [id number]  MEDICAL RECORD NO.:  [id number]  LOCATION:                                 FACILITY:  PHYSICIAN:  Ninetta Lights, M.D.      DATE OF BIRTH:  DATE OF PROCEDURE:  01/30/2017 DATE OF DISCHARGE:                              OPERATIVE REPORT   PREOPERATIVE DIAGNOSES:  Right knee medial meniscus tear. Chondromalacia of patella.  POSTOPERATIVE DIAGNOSES:  Right knee medial meniscus tear. Chondromalacia of patella with some grade 2 and 3 chondromalacia of lateral patellar facet and lateral femoral condyle, grade 2 medial femoral condyle.  Radial tear posterior horn medial meniscus.  PROCEDURE:  Right knee exam under anesthesia, arthroscopy.  Partial medial meniscectomy.  Tricompartmental chondroplasty.  SURGEON:  Ninetta Lights, M.D.  ASSISTANT:  Tawanna Cooler, PA.  ANESTHESIA:  General.  BLOOD LOSS:  Minimal.  SPECIMENS:  None.  CULTURES:  None.  COMPLICATIONS:  None.  DRESSINGS:  Sterile compressive.  TOURNIQUET:  Not employed.  DESCRIPTION OF PROCEDURE:  The patient was brought to the operating room, placed on operating table in supine position.  After adequate anesthesia had been obtained, leg holder applied, leg prepped and draped in usual sterile fashion.  Two portals; one each medial and lateral parapatellar.  Arthroscope was introduced, knee distended, and inspected.  Some grade 2 and 3 changes of lateral patella debrided.  A lot of hypertrophic synovitis debrided throughout.  After doing chondroplasty on the patella looked laterally.  No meniscal tears, but some fairly extensive grade 3 changes debrided.  One area almost exposed bone, but very small.  Cruciate ligament intact.  Medial meniscus radial tear, posterior horn.  This was removed saucerizing out the posterior third tapered into remaining meniscus.  Some grade 2 changes on the condyle debrided.  Entire knee examined.  No other findings  were appreciated.  Instruments were fully removed.  Portals were closed with nylon.  Knee injected with Depo-Medrol and Marcaine.  Sterile compressive dressing applied.  Anesthesia reversed.  Brought to the recovery room.  Tolerated the surgery well.  No complications.     Ninetta Lights, M.D.     DFM/MEDQ  D:  01/30/2017  T:  01/30/2017  Job:  [id number]

## 2017-02-02 ENCOUNTER — Encounter (HOSPITAL_BASED_OUTPATIENT_CLINIC_OR_DEPARTMENT_OTHER): Payer: Self-pay | Admitting: Orthopedic Surgery

## 2017-02-10 DIAGNOSIS — S83241D Other tear of medial meniscus, current injury, right knee, subsequent encounter: Secondary | ICD-10-CM | POA: Diagnosis not present

## 2017-03-05 DIAGNOSIS — F419 Anxiety disorder, unspecified: Secondary | ICD-10-CM | POA: Diagnosis not present

## 2017-03-05 DIAGNOSIS — R079 Chest pain, unspecified: Secondary | ICD-10-CM | POA: Diagnosis not present

## 2017-03-05 DIAGNOSIS — L259 Unspecified contact dermatitis, unspecified cause: Secondary | ICD-10-CM | POA: Diagnosis not present

## 2017-03-05 DIAGNOSIS — M25561 Pain in right knee: Secondary | ICD-10-CM | POA: Diagnosis not present

## 2017-03-25 DIAGNOSIS — S83241D Other tear of medial meniscus, current injury, right knee, subsequent encounter: Secondary | ICD-10-CM | POA: Diagnosis not present

## 2017-04-16 IMAGING — US US ABDOMEN COMPLETE
1 series · 14 of 25 positions shown · non-contrast
Comparison: CT 10/19/2015 .  MRI 01/11/2015

CLINICAL DATA: Right upper quadrant pain .

EXAM:
ABDOMEN ULTRASOUND COMPLETE

[Series 1: us abdomen complete · 0.20mm/px · 14 of 86 slices shown]
[im 1/86]
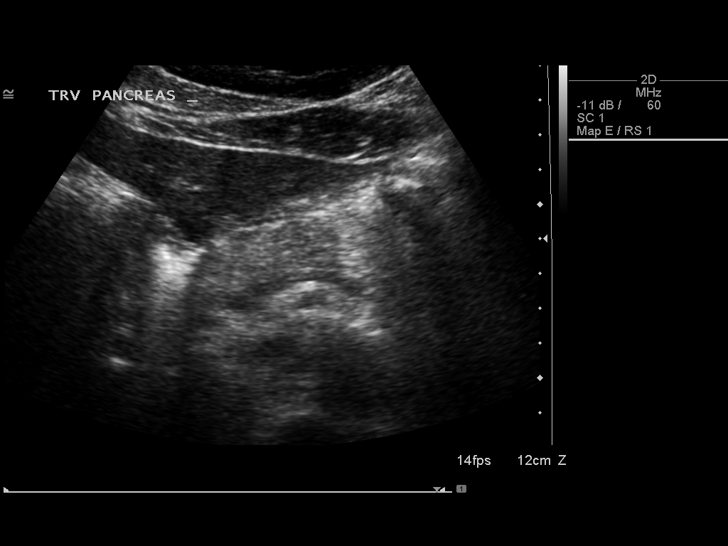
[im 8/86]
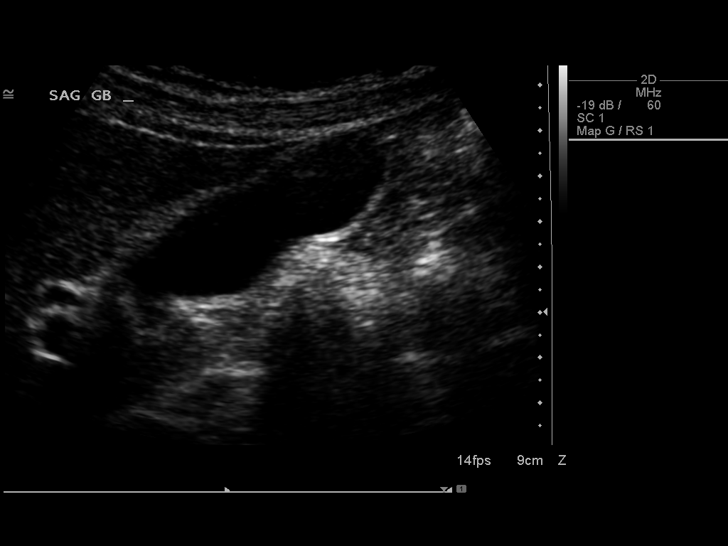
[im 15/86]
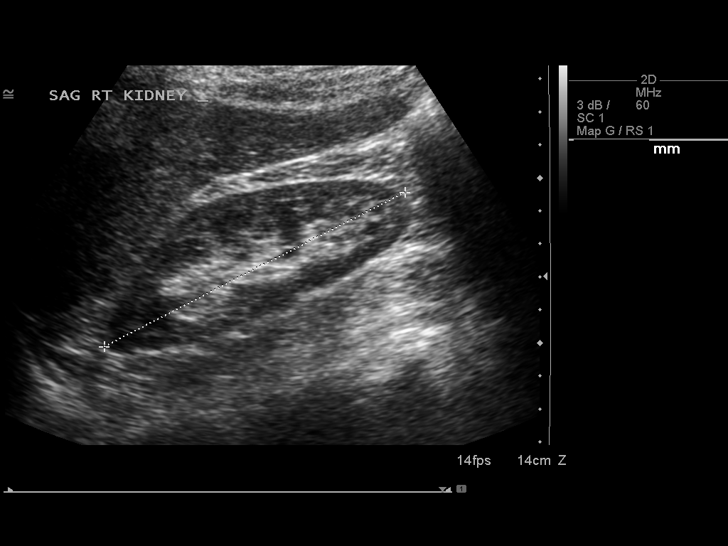
[im 22/86]
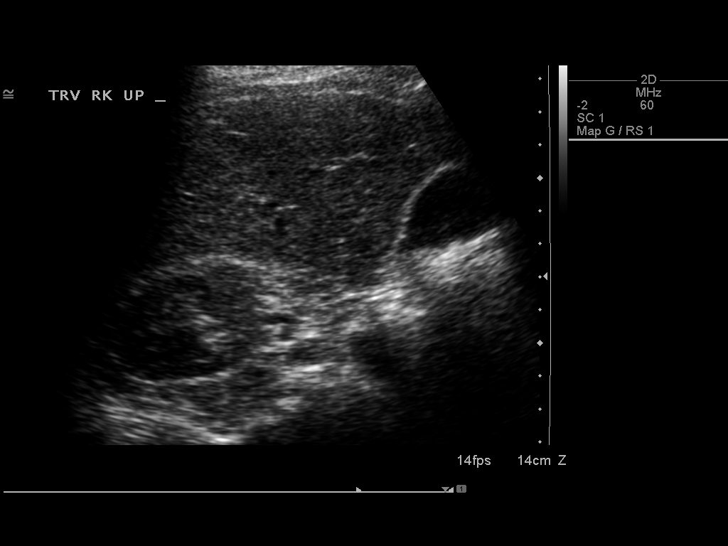
[im 29/86]
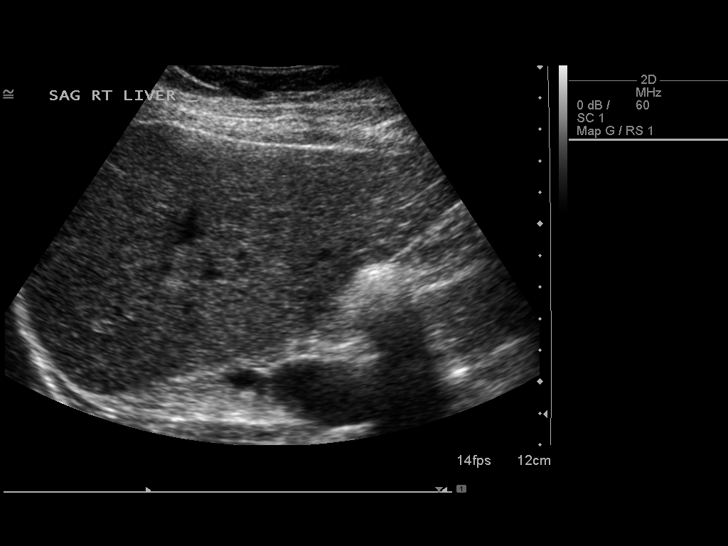
[im 32/86]
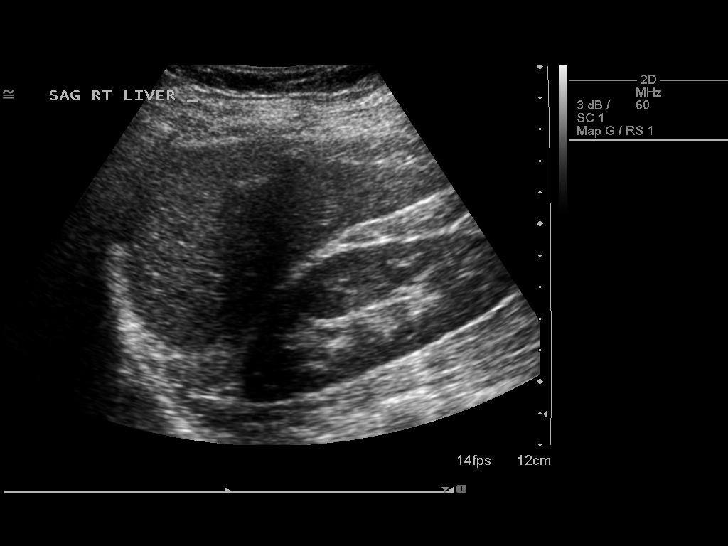
[im 39/86]
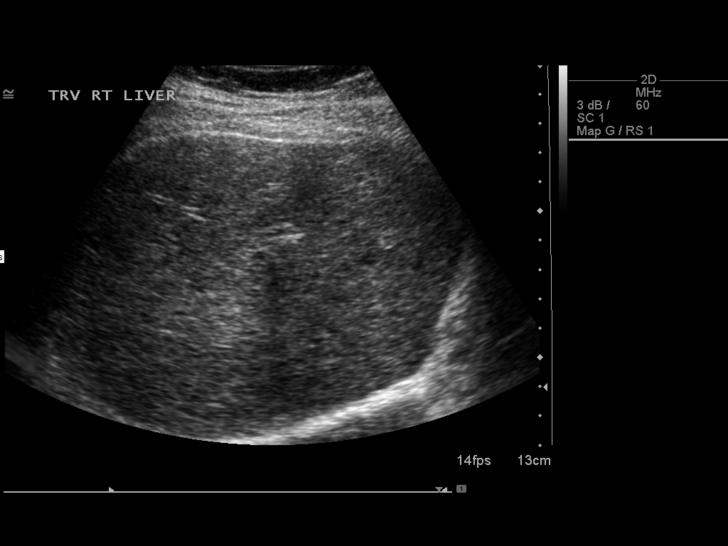
[im 47/86]
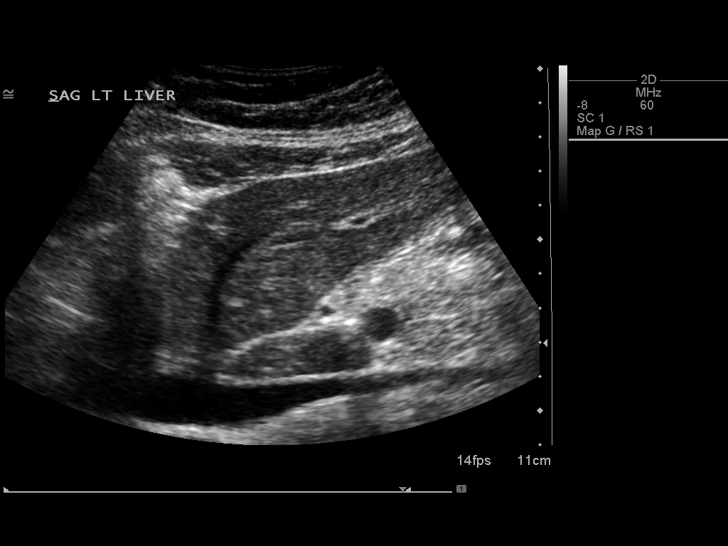
[im 54/86]
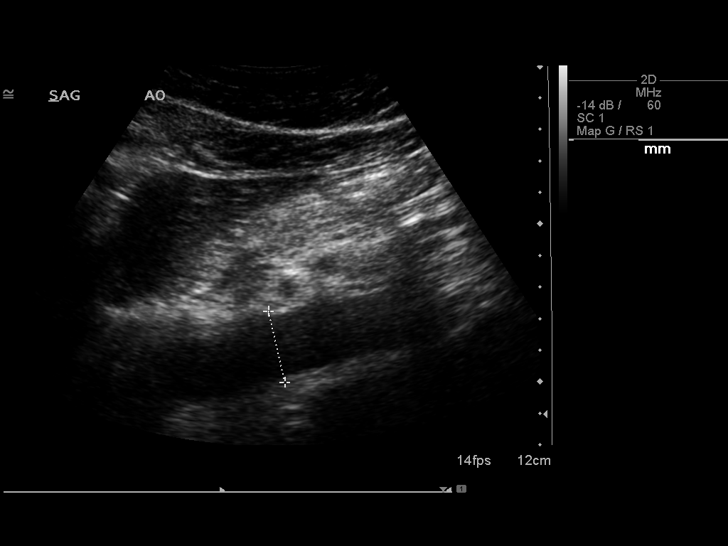
[im 57/86]
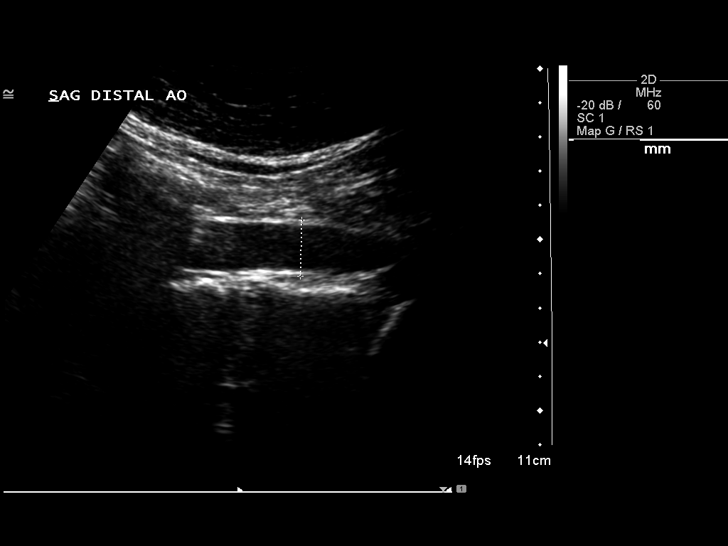
[im 64/86]
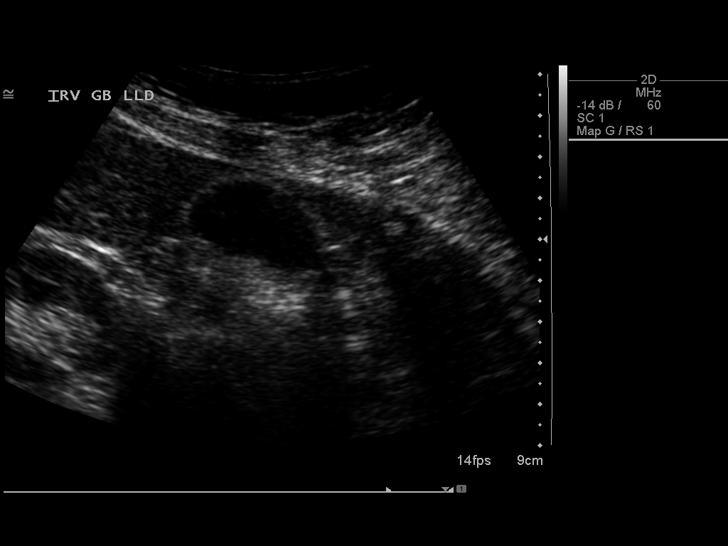
[im 71/86]
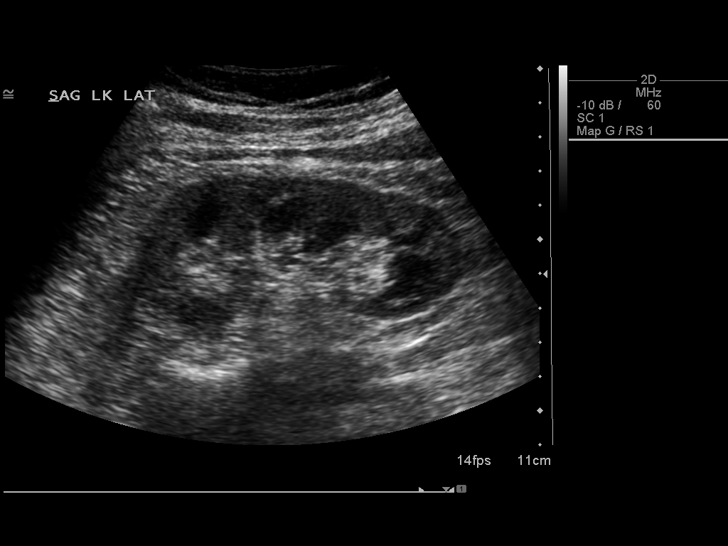
[im 78/86]
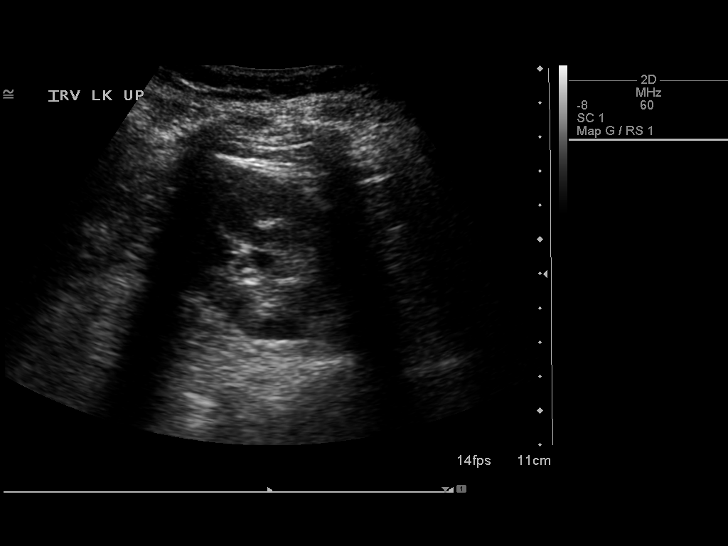
[im 86/86]
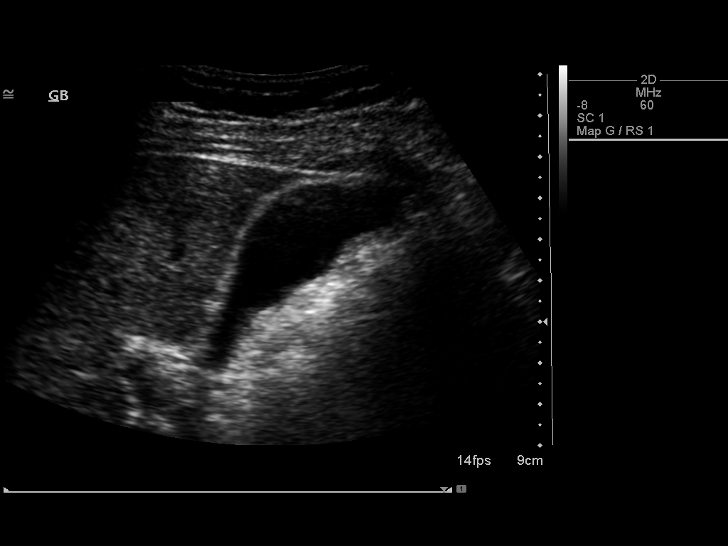

[14 of 25 positions shown; findings below may reference images not displayed]

FINDINGS: Gallbladder: No gallstones or wall thickening visualized. No
sonographic Murphy sign noted by sonographer.

Common bile duct: Diameter: 4.1 mm

Liver: No focal lesion identified. Within normal limits in
parenchymal echogenicity.

IVC: No abnormality visualized.

Pancreas: Visualized portion unremarkable.

Spleen: Size and appearance within normal limits.

Right Kidney: Length: 10.4 cm. Echogenicity within normal limits. No
mass or hydronephrosis visualized.

Left kidney: Length 10.1 cm. Echogenicity within normal limits. No
mass. 1.8 cm small parapelvic cyst versus mild calyceal dilatation .

Abdominal aorta: No aneurysm visualized.

Other findings: None.
IMPRESSION: 1. 1.8 cm left renal parapelvic cyst versus mild calyceal
dilatation. No other focal renal abnormality identified.

2. Exam is otherwise unremarkable.

## 2017-07-16 DIAGNOSIS — Z79899 Other long term (current) drug therapy: Secondary | ICD-10-CM | POA: Diagnosis not present

## 2017-07-16 DIAGNOSIS — L918 Other hypertrophic disorders of the skin: Secondary | ICD-10-CM | POA: Diagnosis not present

## 2017-07-16 DIAGNOSIS — L659 Nonscarring hair loss, unspecified: Secondary | ICD-10-CM | POA: Diagnosis not present

## 2017-07-30 ENCOUNTER — Ambulatory Visit: Payer: BLUE CROSS/BLUE SHIELD | Admitting: Obstetrics and Gynecology

## 2017-07-31 ENCOUNTER — Ambulatory Visit: Payer: BLUE CROSS/BLUE SHIELD | Admitting: Nurse Practitioner

## 2017-08-11 ENCOUNTER — Other Ambulatory Visit: Payer: Self-pay

## 2017-08-11 NOTE — Telephone Encounter (Signed)
Medication refill request: premarin vaginal cream  Last AEX:  07-29-16 Next AEX: 08-13-17 Last MMG (if hormonal medication request): 5/17 Refill authorized: rx denied. Pt has appt 08-13-17 at 8:30am & rx request states pt wants to pick up rx at 2pm. Pt can get rx at appt

## 2017-08-12 ENCOUNTER — Other Ambulatory Visit: Payer: Self-pay | Admitting: *Deleted

## 2017-08-12 MED ORDER — ESTROGENS, CONJUGATED 0.625 MG/GM VA CREA: TOPICAL_CREAM | VAGINAL | 0 refills | Status: DC

## 2017-08-12 NOTE — Telephone Encounter (Signed)
Medication refill request: Premarin  Last AEX:  07-29-16  Next AEX: 08-13-17  Last MMG (if hormonal medication request): 06-25-15 WNL  (spoke with patient and advised to scheduled)  Refill authorized: Please advise

## 2017-08-13 ENCOUNTER — Other Ambulatory Visit: Payer: Self-pay

## 2017-08-13 ENCOUNTER — Encounter: Payer: Self-pay | Admitting: Obstetrics and Gynecology

## 2017-08-13 ENCOUNTER — Ambulatory Visit: Payer: BLUE CROSS/BLUE SHIELD | Admitting: Obstetrics and Gynecology

## 2017-08-13 VITALS — BP 112/70 | HR 72 | Resp 16 | Ht 62.25 in | Wt 160.0 lb

## 2017-08-13 DIAGNOSIS — M8580 Other specified disorders of bone density and structure, unspecified site: Secondary | ICD-10-CM

## 2017-08-13 DIAGNOSIS — F329 Major depressive disorder, single episode, unspecified: Secondary | ICD-10-CM

## 2017-08-13 DIAGNOSIS — N393 Stress incontinence (female) (male): Secondary | ICD-10-CM

## 2017-08-13 DIAGNOSIS — Z5181 Encounter for therapeutic drug level monitoring: Secondary | ICD-10-CM | POA: Diagnosis not present

## 2017-08-13 DIAGNOSIS — Z01419 Encounter for gynecological examination (general) (routine) without abnormal findings: Secondary | ICD-10-CM

## 2017-08-13 DIAGNOSIS — R61 Generalized hyperhidrosis: Secondary | ICD-10-CM

## 2017-08-13 DIAGNOSIS — Z79890 Hormone replacement therapy (postmenopausal): Secondary | ICD-10-CM | POA: Diagnosis not present

## 2017-08-13 DIAGNOSIS — R151 Fecal smearing: Secondary | ICD-10-CM

## 2017-08-13 DIAGNOSIS — F32A Depression, unspecified: Secondary | ICD-10-CM

## 2017-08-13 MED ORDER — ESTRADIOL 10 MCG VA TABS: ORAL_TABLET | VAGINAL | 4 refills | Status: DC

## 2017-08-13 MED ORDER — NITROFURANTOIN MONOHYD MACRO 100 MG PO CAPS: 100.0000 mg | ORAL_CAPSULE | ORAL | 4 refills | Status: DC | PRN

## 2017-08-13 MED ORDER — ESTRADIOL 0.025 MG/24HR TD PTTW: 1.0000 | MEDICATED_PATCH | TRANSDERMAL | 4 refills | Status: DC

## 2017-08-13 NOTE — Telephone Encounter (Signed)
Medication refill request: Premarin vaginal cream  Last AEX:  08/13/17 today Next AEX: 09/09/18 Last MMG (if hormonal medication request): 06/25/15 Refill authorized: rx was sent in yesterday for 30 grams with 0 refills to [facility] [address]. rx came in today for [facility] [address]. Left message for patient to call back to let her know that she can transfer her rx if needed.

## 2017-08-13 NOTE — Patient Instructions (Addendum)
EXERCISE AND DIET:  We recommended that you start or continue a regular exercise program for good health. Regular exercise means any activity that makes your heart beat faster and makes you sweat.  We recommend exercising at least 30 minutes per day at least 3 days a week, preferably 4 or 5.  We also recommend a diet low in fat and sugar.  Inactivity, poor dietary choices and obesity can cause diabetes, heart attack, stroke, and kidney damage, among others.    ALCOHOL AND SMOKING:  Women should limit their alcohol intake to no more than 7 drinks/beers/glasses of wine (combined, not each!) per week. Moderation of alcohol intake to this level decreases your risk of breast cancer and liver damage. And of course, no recreational drugs are part of a healthy lifestyle.  And absolutely no smoking or even second hand smoke. Most people know smoking can cause heart and lung diseases, but did you know it also contributes to weakening of your bones? Aging of your skin?  Yellowing of your teeth and nails?  CALCIUM AND VITAMIN D:  Adequate intake of calcium and Vitamin D are recommended.  The recommendations for exact amounts of these supplements seem to change often, but generally speaking 1,200 Kegel Exercises Kegel exercises help strengthen the muscles that support the rectum, vagina, small intestine, bladder, and uterus. Doing Kegel exercises can help:  Improve bladder and bowel control.  Improve sexual response.  Reduce problems and discomfort during pregnancy.  Kegel exercises involve squeezing your pelvic floor muscles, which are the same muscles you squeeze when you try to stop the flow of urine. The exercises can be done while sitting, standing, or lying down, but it is best to vary your position. Phase 1 exercises 1. Squeeze your pelvic floor muscles tight. You should feel a tight lift in your rectal area. If you are a female, you should also feel a tightness in your vaginal area. Keep your stomach,  buttocks, and legs relaxed. 2. Hold the muscles tight for up to 10 seconds. 3. Relax your muscles. Repeat this exercise 50 times a day or as many times as told by your health care provider. Continue to do this exercise for at least 4-6 weeks or for as long as told by your health care provider. This information is not intended to replace advice given to you by your health care provider. Make sure you discuss any questions you have with your health care provider. Document Released: 01/28/2012 Document Revised: 10/06/2015 Document Reviewed: 12/31/2014 Elsevier Interactive Patient Education  2018 [place] Breast self-awareness means being familiar with how your breasts look and feel. It involves checking your breasts regularly and reporting any changes to your health care provider. Practicing breast self-awareness is important. A change in your breasts can be a sign of a serious medical problem. Being familiar with how your breasts look and feel allows you to find any problems early, when treatment is more likely to be successful. All women should practice breast self-awareness, including women who have had breast implants. How to do a breast self-exam One way to learn what is normal for your breasts and whether your breasts are changing is to do a breast self-exam. To do a breast self-exam: Look for Changes  1. Remove all the clothing above your waist. 2. Stand in front of a mirror in a room with good lighting. 3. Put your hands on your hips. 4. Push your hands firmly downward. 5. Compare your breasts in the  mirror. Look for differences between them (asymmetry), such as: ? Differences in shape. ? Differences in size. ? Puckers, dips, and bumps in one breast and not the other. 6. Look at each breast for changes in your skin, such as: ? Redness. ? Scaly areas. 7. Look for changes in your nipples, such as: ? Discharge. ? Bleeding. ? Dimpling. ? Redness. ? A  change in position. Feel for Changes  Carefully feel your breasts for lumps and changes. It is best to do this while lying on your back on the floor and again while sitting or standing in the shower or tub with soapy water on your skin. Feel each breast in the following way:  Place the arm on the side of the breast you are examining above your head.  Feel your breast with the other hand.  Start in the nipple area and make  inch (2 cm) overlapping circles to feel your breast. Use the pads of your three middle fingers to do this. Apply light pressure, then medium pressure, then firm pressure. The light pressure will allow you to feel the tissue closest to the skin. The medium pressure will allow you to feel the tissue that is a little deeper. The firm pressure will allow you to feel the tissue close to the ribs.  Continue the overlapping circles, moving downward over the breast until you feel your ribs below your breast.  Move one finger-width toward the center of the body. Continue to use the  inch (2 cm) overlapping circles to feel your breast as you move slowly up toward your collarbone.  Continue the up and down exam using all three pressures until you reach your armpit.  Write Down What You Find  Write down what is normal for each breast and any changes that you find. Keep a written record with breast changes or normal findings for each breast. By writing this information down, you do not need to depend only on memory for size, tenderness, or location. Write down where you are in your menstrual cycle, if you are still menstruating. If you are having trouble noticing differences in your breasts, do not get discouraged. With time you will become more familiar with the variations in your breasts and more comfortable with the exam. How often should I examine my breasts? Examine your breasts every month. If you are breastfeeding, the best time to examine your breasts is after a feeding or after  using a breast pump. If you menstruate, the best time to examine your breasts is 5-7 days after your period is over. During your period, your breasts are lumpier, and it may be more difficult to notice changes. When should I see my health care provider? See your health care provider if you notice:  A change in shape or size of your breasts or nipples.  A change in the skin of your breast or nipples, such as a reddened or scaly area.  Unusual discharge from your nipples.  A lump or thick area that was not there before.  Pain in your breasts.  Anything that concerns you.  This information is not intended to replace advice given to you by your health care provider. Make sure you discuss any questions you have with your health care provider. Document Released: 02/10/2005 Document Revised: 07/19/2015 Document Reviewed: 12/31/2014 Elsevier Interactive Patient Education  2018 [place].  mg of calcium (either carbonate or citrate) and 800 units of Vitamin D per day seems prudent. Certain women may benefit  from higher intake of Vitamin D.  If you are among these women, your doctor will have told you during your visit.    PAP SMEARS:  Pap smears, to check for cervical cancer or precancers,  have traditionally been done yearly, although recent scientific advances have shown that most women can have pap smears less often.  However, every woman still should have a physical exam from her gynecologist every year. It will include a breast check, inspection of the vulva and vagina to check for abnormal growths or skin changes, a visual exam of the cervix, and then an exam to evaluate the size and shape of the uterus and ovaries.  And after 63 years of age, a rectal exam is indicated to check for rectal cancers. We will also provide age appropriate advice regarding health maintenance, like when you should have certain vaccines, screening for sexually transmitted diseases, bone density testing, colonoscopy,  mammograms, etc.   MAMMOGRAMS:  All women over 68 years old should have a yearly mammogram. Many facilities now offer a "3D" mammogram, which may cost around $50 extra out of pocket. If possible,  we recommend you accept the option to have the 3D mammogram performed.  It both reduces the number of women who will be called back for extra views which then turn out to be normal, and it is better than the routine mammogram at detecting truly abnormal areas.    COLONOSCOPY:  Colonoscopy to screen for colon cancer is recommended for all women at age 89.  We know, you hate the idea of the prep.  We agree, BUT, having colon cancer and not knowing it is worse!!  Colon cancer so often starts as a polyp that can be seen and removed at colonscopy, which can quite literally save your life!  And if your first colonoscopy is normal and you have no family history of colon cancer, most women don't have to have it again for 10 years.  Once every ten years, you can do something that may end up saving your life, right?  We will be happy to help you get it scheduled when you are ready.  Be sure to check your insurance coverage so you understand how much it will cost.  It may be covered as a preventative service at no cost, but you should check your particular policy.

## 2017-08-13 NOTE — Progress Notes (Signed)
62 y.o. [id number] MarriedCaucasianF here for annual exam.  H/O hysterectomy, on ERT oral and vaginal. Also on prophylactic antibiotics with intercourse.     She is still having night sweats, 5 out of 7 nights a week. No significant hot flashes.  She has issues with depression. She is having trouble with insomnia. She is snoring. In addition she is trouble with left carpale tunnel and hair loss.   In the process of building a house in the [place], it has been very stressful. Does [place]. Travels a lot. Kids are in [place], just had her first Grandchild Ardine Eng is 8 months). Lost a Grandchild previously (anecephaly). Her Father died in the last few years.  The depression started when she had a frozen shoulder. She is on medications with her primary MD.  Husband is supportive. She is able to find joy, no thoughts of hurting herself.   She c/o fecal incontinence for a couple of years. Goes from constipation to diarrhea. She was taking psyllium previously and it did help. Leaking small amounts of soft stool, happens at least 2 x a week. On her bottom, in the crease, not in her underwear. When she goes back to the bathroom and wipes there is stool when she wipes. She occasionally has hemorrhoids.   She c/o GSI when she sneezes, just a drop. Started in the last 6 months.    Patient's last menstrual period was 06/25/1993.          Sexually active: Yes.    The current method of family planning is status post hysterectomy.    Exercising: No.  The patient does not participate in regular exercise at present. Smoker:  no  Health Maintenance: Pap:  unsure History of abnormal Pap:  no MMG:  06-25-15 WNL  Colonoscopy:  2018 normal  BMD:   04-02-15 Osteopenia  TDaP:  06-18-15 Gardasil: N/A   reports that she has never smoked. She has never used smokeless tobacco. She reports that she drinks about 0.6 oz of alcohol per week. She reports that she does not use drugs.  Past Medical  History:  Diagnosis Date  . Anxiety   . Complication of anesthesia    hard to wake from general anesthesia  . Contact lens/glasses fitting    wears contacts or glasses  . Depression   . Endometriosis   . Fibroids   . Headache   . MMT (medial meniscus tear)    right knee  . Recurrent UTI     Past Surgical History:  Procedure Laterality Date  . ABDOMINAL HYSTERECTOMY  06/25/93   secondary to endometriosis  . APPENDECTOMY  1995  . BUNIONECTOMY Left 04/2011  . CARPAL TUNNEL RELEASE Right 07/07/2013   Procedure: RIGHT CARPAL TUNNEL RELEASE;  Surgeon: Cammie Sickle., MD;  Location: [facility];  Service: Orthopedics;  Laterality: Right;  . CESAREAN SECTION     x2  . CHONDROPLASTY Right 01/30/2017   Procedure: CHONDROPLASTY;  Surgeon: Ninetta Lights, MD;  Location: [facility];  Service: Orthopedics;  Laterality: Right;  . COLONOSCOPY W/ BIOPSIES  06/25/2005   colitis - recheck in 10 years  . DILATION AND CURETTAGE OF UTERUS  12/92   Hysteroscopy  . HYSTEROSCOPY  12/92  . KNEE ARTHROSCOPY WITH MEDIAL MENISECTOMY Right 01/30/2017   Procedure: RIGHT KNEE ARTHROSCOPY WITH MEDIAL MENISECTOMY;  Surgeon: Ninetta Lights, MD;  Location: [facility];  Service: Orthopedics;  Laterality: Right;  . SHOULDER  ARTHROSCOPY Right 10/15/2000   with debridement, DCE and manipulation  . SHOULDER SURGERY     2202-2005 bilateral frozen shoulders  . TENNIS ELBOW RELEASE/NIRSCHEL PROCEDURE Left 01/10/2016   Procedure: LEFT TENNIS ELBOW RELEASE/DEBRIDEMENT;  Surgeon: Ninetta Lights, MD;  Location: [facility];  Service: Orthopedics;  Laterality: Left;  . torn tendon     right elbow    Current Outpatient Medications  Medication Sig Dispense Refill  . ALPRAZolam (XANAX) 0.25 MG tablet Take 1 tablet by mouth 2 (two) times daily as needed.   2  . Ascorbic Acid (VITAMIN C PO) Take by mouth.    Marland Kitchen aspirin 81 MG tablet Take 81 mg by mouth daily.    .  B Complex Vitamins (B COMPLEX PO) Take by mouth.    . Biotin 10 MG TABS Take 1 tablet by mouth daily.    Marland Kitchen buPROPion (WELLBUTRIN XL) 150 MG 24 hr tablet Take 450 mg by mouth daily.     . calcium citrate (CALCITRATE - DOSED IN MG ELEMENTAL CALCIUM) 950 MG tablet Take 200 mg of elemental calcium by mouth daily.    . cholecalciferol (VITAMIN D) 1000 UNITS tablet Take 1,000 Units by mouth daily.    Marland Kitchen conjugated estrogens (PREMARIN) vaginal cream Apply a pea sized amount at the urethra prn. 30 g 0  . CRANBERRY PO Take by mouth.    . estradiol (ESTRACE) 1 MG tablet Take 0.5 tablets (0.5 mg total) by mouth daily. 90 tablet 4  . Estradiol (VAGIFEM) 10 MCG TABS vaginal tablet Use twice a week 24 tablet 4  . MAGNESIUM PO Take by mouth.    . naproxen sodium (ALEVE) 220 MG tablet Take 220 mg by mouth.    . Naproxen-Esomeprazole (VIMOVO) 375-20 MG TBEC Take by mouth.    . nitrofurantoin, macrocrystal-monohydrate, (MACROBID) 100 MG capsule Take 1 capsule (100 mg total) by mouth as needed (after intercourse). 30 capsule 4  . Omega-3 Fatty Acids (FISH OIL PO) Take by mouth.    . Probiotic Product (PROBIOTIC PO) Take by mouth.    . Vilazodone HCl (VIIBRYD) 40 MG TABS Take 40 mg by mouth daily.      No current facility-administered medications for this visit.     Family History  Problem Relation Age of Onset  . Hypertension Mother   . Osteoarthritis Mother   . Rheum arthritis Mother   . Heart failure Father   . Hypertension Father   . Lung cancer Father 5  . COPD Father   . Depression Father   . Heart disease Father        CABG X 3 in 1990 mid 60's  . Hypertension Maternal Grandfather   . Stroke Maternal Grandfather   . Parkinson's disease Maternal Grandmother   . Depression Paternal Grandmother   . Heart failure Paternal Grandfather   . Depression Sister        X 2  . Heart failure Paternal Uncle   . Lung cancer Paternal Uncle   . Heart failure Paternal Uncle     Review of Systems   Constitutional: Negative.   HENT: Negative.   Eyes: Negative.   Respiratory: Negative.   Cardiovascular: Negative.   Gastrointestinal: Negative.   Endocrine:       Hair loss   Genitourinary:       Los of urine with cough  Musculoskeletal: Positive for myalgias.  Skin: Negative.   Allergic/Immunologic: Negative.   Neurological: Positive for headaches.  Psychiatric/Behavioral:  Depression    Exam:   BP 112/70 (BP Location: Right Arm, Patient Position: Sitting, Cuff Size: Normal)   Pulse 72   Resp 16   Ht 5' 2.25" (1.581 m)   Wt 160 lb (72.6 kg)   LMP 06/25/1993   BMI 29.03 kg/m   Weight change: @WEIGHTCHANGE @ Height:   Height: 5' 2.25" (158.1 cm)  Ht Readings from Last 3 Encounters:  08/13/17 5' 2.25" (1.581 m)  01/30/17 5\' 2"  (1.575 m)  07/29/16 5\' 2"  (1.575 m)    General appearance: alert, cooperative and appears stated age Head: Normocephalic, without obvious abnormality, atraumatic Neck: no adenopathy, supple, symmetrical, trachea midline and thyroid normal to inspection and palpation Lungs: clear to auscultation bilaterally Cardiovascular: regular rate and rhythm Breasts: normal appearance, no masses or tenderness Abdomen: soft, non-tender; non distended,  no masses,  no organomegaly Extremities: extremities normal, atraumatic, no cyanosis or edema Skin: Skin color, texture, turgor normal. No rashes or lesions Lymph nodes: Cervical, supraclavicular, and axillary nodes normal. No abnormal inguinal nodes palpated Neurologic: Grossly normal   Pelvic: External genitalia:  no lesions              Urethra:  normal appearing urethra with no masses, tenderness or lesions              Bartholins and Skenes: normal                 Vagina: normal appearing vagina with normal color and discharge, no lesions. Well estrogenized.               Cervix: absent               Bimanual Exam:  Uterus:  uterus absent              Adnexa: no mass, fullness, tenderness                Rectovaginal: Confirms               Anus:  normal sphincter tone, no lesions  Kegel strength is 4/5  Chaperone was present for exam.  A:  Well Woman with normal exam  Insomnia, snoring  ERT, wants to continue, aware of the risks. Discussed changing to topical estrogen  Mild GSI  Fecal staining  P:   No pap   Recommended that she speak with her primary about a sleep study  Mammogram overdue  DEXA in the next 1-3 years  Discussed breast self exam  Discussed calcium and vit D intake  Urine for ua, c&s  Restart Psyllium  Pelvic floor PT  F/U with primary for labs and depression

## 2017-08-13 NOTE — Telephone Encounter (Signed)
Spoke with patient. The patient will transfer [facility] to [facility] on [address]

## 2017-08-14 ENCOUNTER — Encounter: Payer: Self-pay | Admitting: Obstetrics and Gynecology

## 2017-08-14 LAB — URINALYSIS, MICROSCOPIC ONLY: CASTS: NONE SEEN /LPF

## 2017-08-14 LAB — URINE CULTURE: Organism ID, Bacteria: NO GROWTH

## 2017-08-18 ENCOUNTER — Ambulatory Visit: Payer: Self-pay

## 2017-08-18 ENCOUNTER — Ambulatory Visit (INDEPENDENT_AMBULATORY_CARE_PROVIDER_SITE_OTHER): Payer: BLUE CROSS/BLUE SHIELD

## 2017-08-18 VITALS — BP 132/68 | HR 72 | Resp 14 | Ht 62.5 in | Wt 161.4 lb

## 2017-08-18 DIAGNOSIS — R8290 Unspecified abnormal findings in urine: Secondary | ICD-10-CM

## 2017-08-18 LAB — POCT URINALYSIS DIPSTICK
BILIRUBIN UA: NEGATIVE
Blood, UA: NEGATIVE
Glucose, UA: NEGATIVE
Ketones, UA: NEGATIVE
Leukocytes, UA: NEGATIVE
NITRITE: NEGATIVE
PROTEIN UA: NEGATIVE
Urobilinogen, UA: 0.2 E.U./dL
pH, UA: 5 (ref 5.0–8.0)

## 2017-08-18 NOTE — Progress Notes (Signed)
Patient here for clean catch ua and micro. Patient denies unitary symptoms. Clean catch urin sent for sample. Patient aware that results will be sent to her once Dr. Talbert Nan reviews.

## 2017-08-19 LAB — URINALYSIS, MICROSCOPIC ONLY
CASTS: NONE SEEN /LPF
EPITHELIAL CELLS (NON RENAL): NONE SEEN /HPF (ref 0–10)
RBC, UA: NONE SEEN /hpf (ref 0–2)
WBC, UA: NONE SEEN /hpf (ref 0–5)

## 2017-09-10 DIAGNOSIS — F419 Anxiety disorder, unspecified: Secondary | ICD-10-CM | POA: Diagnosis not present

## 2017-09-10 DIAGNOSIS — L259 Unspecified contact dermatitis, unspecified cause: Secondary | ICD-10-CM | POA: Diagnosis not present

## 2017-09-10 DIAGNOSIS — R079 Chest pain, unspecified: Secondary | ICD-10-CM | POA: Diagnosis not present

## 2017-09-10 DIAGNOSIS — Z6829 Body mass index (BMI) 29.0-29.9, adult: Secondary | ICD-10-CM | POA: Diagnosis not present

## 2017-10-05 ENCOUNTER — Other Ambulatory Visit: Payer: Self-pay

## 2017-10-05 NOTE — Telephone Encounter (Signed)
Medication refill request: Estradiol 1mg   Last AEX:  08/13/17 Next AEX: 09/09/18 Last MMG (if hormonal medication request): 5/1/1/7  Bi-rads category 1 neg .  Refill authorized: Please advise

## 2017-10-05 NOTE — Telephone Encounter (Signed)
The script for vagifem was sent at the time of her annual exam in 6/19. Did the pharmacy not get it? Please remind the patient to set up her mammogram. She really shouldn't continue estrogen without the mammogram.

## 2017-10-06 NOTE — Telephone Encounter (Signed)
Spoke with patient. Advised spoke with her pharmacy and they were filling her old rx. They do have the new rx on file and will begin using this one for future fills. Advised patient she will need to schedule a mammogram to continue on estrogen. Patient verbalizes understanding. States [place] is [place] and will schedule when she returns.  Routing to provider for final review. Patient agreeable to disposition. Will close encounter.

## 2017-10-22 ENCOUNTER — Other Ambulatory Visit: Payer: Self-pay | Admitting: Obstetrics and Gynecology

## 2017-10-22 DIAGNOSIS — Z1231 Encounter for screening mammogram for malignant neoplasm of breast: Secondary | ICD-10-CM

## 2017-11-09 DIAGNOSIS — G5602 Carpal tunnel syndrome, left upper limb: Secondary | ICD-10-CM | POA: Diagnosis not present

## 2017-11-09 DIAGNOSIS — M720 Palmar fascial fibromatosis [Dupuytren]: Secondary | ICD-10-CM | POA: Diagnosis not present

## 2017-11-18 ENCOUNTER — Ambulatory Visit
Admission: RE | Admit: 2017-11-18 | Discharge: 2017-11-18 | Disposition: A | Discharge status: Home / Self Care | Payer: BLUE CROSS/BLUE SHIELD | Source: Ambulatory Visit | Attending: Obstetrics and Gynecology | Admitting: Obstetrics and Gynecology

## 2017-11-18 DIAGNOSIS — Z1231 Encounter for screening mammogram for malignant neoplasm of breast: Secondary | ICD-10-CM | POA: Diagnosis not present

## 2017-11-19 ENCOUNTER — Other Ambulatory Visit: Payer: Self-pay | Admitting: Obstetrics and Gynecology

## 2017-11-19 DIAGNOSIS — R928 Other abnormal and inconclusive findings on diagnostic imaging of breast: Secondary | ICD-10-CM

## 2017-11-23 ENCOUNTER — Other Ambulatory Visit: Payer: Self-pay | Admitting: Obstetrics and Gynecology

## 2017-11-23 ENCOUNTER — Ambulatory Visit
Admission: RE | Admit: 2017-11-23 | Discharge: 2017-11-23 | Disposition: A | Discharge status: Home / Self Care | Payer: BLUE CROSS/BLUE SHIELD | Source: Ambulatory Visit | Attending: Obstetrics and Gynecology | Admitting: Obstetrics and Gynecology

## 2017-11-23 DIAGNOSIS — R928 Other abnormal and inconclusive findings on diagnostic imaging of breast: Secondary | ICD-10-CM

## 2017-11-23 DIAGNOSIS — N6489 Other specified disorders of breast: Secondary | ICD-10-CM | POA: Diagnosis not present

## 2017-11-23 DIAGNOSIS — R922 Inconclusive mammogram: Secondary | ICD-10-CM | POA: Diagnosis not present

## 2017-11-25 ENCOUNTER — Other Ambulatory Visit: Payer: Self-pay | Admitting: Obstetrics and Gynecology

## 2017-11-25 DIAGNOSIS — N6489 Other specified disorders of breast: Secondary | ICD-10-CM

## 2017-11-26 DIAGNOSIS — H40011 Open angle with borderline findings, low risk, right eye: Secondary | ICD-10-CM | POA: Diagnosis not present

## 2017-11-26 DIAGNOSIS — H40051 Ocular hypertension, right eye: Secondary | ICD-10-CM | POA: Diagnosis not present

## 2017-12-23 DIAGNOSIS — G5602 Carpal tunnel syndrome, left upper limb: Secondary | ICD-10-CM | POA: Diagnosis not present

## 2017-12-25 ENCOUNTER — Other Ambulatory Visit: Payer: Self-pay | Admitting: Orthopedic Surgery

## 2018-01-13 DIAGNOSIS — Z79899 Other long term (current) drug therapy: Secondary | ICD-10-CM | POA: Diagnosis not present

## 2018-01-13 DIAGNOSIS — L810 Postinflammatory hyperpigmentation: Secondary | ICD-10-CM | POA: Diagnosis not present

## 2018-01-13 DIAGNOSIS — L659 Nonscarring hair loss, unspecified: Secondary | ICD-10-CM | POA: Diagnosis not present

## 2018-03-03 ENCOUNTER — Other Ambulatory Visit: Payer: Self-pay

## 2018-03-03 ENCOUNTER — Encounter (HOSPITAL_BASED_OUTPATIENT_CLINIC_OR_DEPARTMENT_OTHER): Payer: Self-pay | Admitting: *Deleted

## 2018-03-11 ENCOUNTER — Other Ambulatory Visit: Payer: Self-pay

## 2018-03-11 ENCOUNTER — Ambulatory Visit (HOSPITAL_BASED_OUTPATIENT_CLINIC_OR_DEPARTMENT_OTHER): Payer: BLUE CROSS/BLUE SHIELD | Admitting: Anesthesiology

## 2018-03-11 ENCOUNTER — Encounter (HOSPITAL_BASED_OUTPATIENT_CLINIC_OR_DEPARTMENT_OTHER)
Admission: RE | Disposition: A | Discharge status: Home / Self Care | Payer: Self-pay | Source: Home / Self Care | Attending: Orthopedic Surgery

## 2018-03-11 ENCOUNTER — Ambulatory Visit (HOSPITAL_BASED_OUTPATIENT_CLINIC_OR_DEPARTMENT_OTHER)
Admission: RE | Admit: 2018-03-11 | Discharge: 2018-03-11 | Disposition: A | Discharge status: Home / Self Care | Payer: BLUE CROSS/BLUE SHIELD | Attending: Orthopedic Surgery | Admitting: Orthopedic Surgery

## 2018-03-11 ENCOUNTER — Encounter (HOSPITAL_BASED_OUTPATIENT_CLINIC_OR_DEPARTMENT_OTHER): Payer: Self-pay

## 2018-03-11 DIAGNOSIS — Z880 Allergy status to penicillin: Secondary | ICD-10-CM | POA: Insufficient documentation

## 2018-03-11 DIAGNOSIS — Z79899 Other long term (current) drug therapy: Secondary | ICD-10-CM | POA: Insufficient documentation

## 2018-03-11 DIAGNOSIS — Z882 Allergy status to sulfonamides status: Secondary | ICD-10-CM | POA: Insufficient documentation

## 2018-03-11 DIAGNOSIS — F329 Major depressive disorder, single episode, unspecified: Secondary | ICD-10-CM | POA: Insufficient documentation

## 2018-03-11 DIAGNOSIS — Z881 Allergy status to other antibiotic agents status: Secondary | ICD-10-CM | POA: Insufficient documentation

## 2018-03-11 DIAGNOSIS — F419 Anxiety disorder, unspecified: Secondary | ICD-10-CM | POA: Insufficient documentation

## 2018-03-11 DIAGNOSIS — G5602 Carpal tunnel syndrome, left upper limb: Secondary | ICD-10-CM | POA: Insufficient documentation

## 2018-03-11 DIAGNOSIS — M720 Palmar fascial fibromatosis [Dupuytren]: Secondary | ICD-10-CM | POA: Diagnosis not present

## 2018-03-11 HISTORY — PX: CARPAL TUNNEL RELEASE: SHX101

## 2018-03-11 SURGERY — CARPAL TUNNEL RELEASE
Anesthesia: Monitor Anesthesia Care | Site: Wrist | Laterality: Left

## 2018-03-11 MED ORDER — TRAMADOL HCL 50 MG PO TABS: 50.0000 mg | ORAL_TABLET | Freq: Four times a day (QID) | ORAL | 0 refills | Status: DC | PRN

## 2018-03-11 MED ORDER — CHLORHEXIDINE GLUCONATE 4 % EX LIQD: 60.0000 mL | Freq: Once | CUTANEOUS | Status: DC

## 2018-03-11 MED ORDER — SCOPOLAMINE 1 MG/3DAYS TD PT72: 1.0000 | MEDICATED_PATCH | Freq: Once | TRANSDERMAL | Status: DC | PRN

## 2018-03-11 MED ORDER — FENTANYL CITRATE (PF) 100 MCG/2ML IJ SOLN: 50.0000 ug | INTRAMUSCULAR | Status: DC | PRN

## 2018-03-11 MED ORDER — DEXAMETHASONE SODIUM PHOSPHATE 10 MG/ML IJ SOLN
INTRAMUSCULAR | Status: DC | PRN
  Administered 2018-03-11: 5 mg via INTRAVENOUS

## 2018-03-11 MED ORDER — MIDAZOLAM HCL 2 MG/2ML IJ SOLN
INTRAMUSCULAR | Status: AC
  Filled 2018-03-11: qty 2

## 2018-03-11 MED ORDER — VANCOMYCIN HCL IN DEXTROSE 1-5 GM/200ML-% IV SOLN
INTRAVENOUS | Status: AC
  Filled 2018-03-11: qty 200

## 2018-03-11 MED ORDER — ONDANSETRON HCL 4 MG/2ML IJ SOLN
INTRAMUSCULAR | Status: AC
  Filled 2018-03-11: qty 2

## 2018-03-11 MED ORDER — FENTANYL CITRATE (PF) 100 MCG/2ML IJ SOLN
INTRAMUSCULAR | Status: AC
  Filled 2018-03-11: qty 2

## 2018-03-11 MED ORDER — VANCOMYCIN HCL IN DEXTROSE 1-5 GM/200ML-% IV SOLN
1000.0000 mg | INTRAVENOUS | Status: AC
  Administered 2018-03-11: 1000 mg via INTRAVENOUS

## 2018-03-11 MED ORDER — MIDAZOLAM HCL 2 MG/2ML IJ SOLN: 1.0000 mg | INTRAMUSCULAR | Status: DC | PRN

## 2018-03-11 MED ORDER — LIDOCAINE HCL (PF) 0.5 % IJ SOLN
INTRAMUSCULAR | Status: DC | PRN
  Administered 2018-03-11: 30 mL via INTRAVENOUS

## 2018-03-11 MED ORDER — LACTATED RINGERS IV SOLN
INTRAVENOUS | Status: DC
  Administered 2018-03-11: 10:00:00 via INTRAVENOUS

## 2018-03-11 MED ORDER — MIDAZOLAM HCL 5 MG/5ML IJ SOLN
INTRAMUSCULAR | Status: DC | PRN
  Administered 2018-03-11: 2 mg via INTRAVENOUS

## 2018-03-11 MED ORDER — FENTANYL CITRATE (PF) 100 MCG/2ML IJ SOLN
INTRAMUSCULAR | Status: DC | PRN
  Administered 2018-03-11 (×2): 25 ug via INTRAVENOUS

## 2018-03-11 MED ORDER — LIDOCAINE 2% (20 MG/ML) 5 ML SYRINGE
INTRAMUSCULAR | Status: AC
  Filled 2018-03-11: qty 5

## 2018-03-11 MED ORDER — BUPIVACAINE HCL (PF) 0.25 % IJ SOLN
INTRAMUSCULAR | Status: DC | PRN
  Administered 2018-03-11: 9 mL

## 2018-03-11 MED ORDER — ONDANSETRON HCL 4 MG/2ML IJ SOLN
INTRAMUSCULAR | Status: DC | PRN
  Administered 2018-03-11: 4 mg via INTRAVENOUS

## 2018-03-11 MED ORDER — PROPOFOL 500 MG/50ML IV EMUL
INTRAVENOUS | Status: DC | PRN
  Administered 2018-03-11: 75 ug/kg/min via INTRAVENOUS

## 2018-03-11 MED ORDER — DEXAMETHASONE SODIUM PHOSPHATE 10 MG/ML IJ SOLN
INTRAMUSCULAR | Status: AC
  Filled 2018-03-11: qty 1

## 2018-03-11 SURGICAL SUPPLY — 39 items
BLADE SURG 15 STRL LF DISP TIS (BLADE) ×1 IMPLANT
BLADE SURG 15 STRL SS (BLADE) ×1
BNDG COHESIVE 3X5 TAN STRL LF (GAUZE/BANDAGES/DRESSINGS) ×2 IMPLANT
BNDG ESMARK 4X9 LF (GAUZE/BANDAGES/DRESSINGS) ×1 IMPLANT
BNDG GAUZE ELAST 4 BULKY (GAUZE/BANDAGES/DRESSINGS) ×2 IMPLANT
CHLORAPREP W/TINT 26ML (MISCELLANEOUS) ×2 IMPLANT
CORD BIPOLAR FORCEPS 12FT (ELECTRODE) ×2 IMPLANT
COVER BACK TABLE 60X90IN (DRAPES) ×2 IMPLANT
COVER MAYO STAND STRL (DRAPES) ×2 IMPLANT
COVER WAND RF STERILE (DRAPES) ×1 IMPLANT
CUFF TOURNIQUET SINGLE 18IN (TOURNIQUET CUFF) ×2 IMPLANT
DRAPE EXTREMITY T 121X128X90 (DISPOSABLE) ×2 IMPLANT
DRAPE SURG 17X23 STRL (DRAPES) ×2 IMPLANT
DRSG PAD ABDOMINAL 8X10 ST (GAUZE/BANDAGES/DRESSINGS) ×2 IMPLANT
GAUZE SPONGE 4X4 12PLY STRL (GAUZE/BANDAGES/DRESSINGS) ×2 IMPLANT
GAUZE XEROFORM 1X8 LF (GAUZE/BANDAGES/DRESSINGS) ×2 IMPLANT
GLOVE BIOGEL PI IND STRL 7.0 (GLOVE) IMPLANT
GLOVE BIOGEL PI IND STRL 8.5 (GLOVE) ×1 IMPLANT
GLOVE BIOGEL PI INDICATOR 7.0 (GLOVE) ×1
GLOVE BIOGEL PI INDICATOR 8.5 (GLOVE) ×1
GLOVE SURG ORTHO 8.0 STRL STRW (GLOVE) ×2 IMPLANT
GLOVE SURG SS PI 6.5 STRL IVOR (GLOVE) ×1 IMPLANT
GLOVE SURG SYN 8.0 (GLOVE) ×2 IMPLANT
GLOVE SURG SYN 8.0 PF PI (GLOVE) IMPLANT
GOWN STRL REIN XL XLG (GOWN DISPOSABLE) ×1 IMPLANT
GOWN STRL REUS W/ TWL LRG LVL3 (GOWN DISPOSABLE) ×1 IMPLANT
GOWN STRL REUS W/TWL LRG LVL3 (GOWN DISPOSABLE) ×1
GOWN STRL REUS W/TWL XL LVL3 (GOWN DISPOSABLE) ×2 IMPLANT
NDL PRECISIONGLIDE 27X1.5 (NEEDLE) IMPLANT
NEEDLE PRECISIONGLIDE 27X1.5 (NEEDLE) IMPLANT
NS IRRIG 1000ML POUR BTL (IV SOLUTION) ×2 IMPLANT
PACK BASIN DAY SURGERY FS (CUSTOM PROCEDURE TRAY) ×2 IMPLANT
STOCKINETTE 4X48 STRL (DRAPES) ×2 IMPLANT
SUT ETHILON 4 0 PS 2 18 (SUTURE) ×2 IMPLANT
SUT VICRYL 4-0 PS2 18IN ABS (SUTURE) IMPLANT
SYR BULB 3OZ (MISCELLANEOUS) ×2 IMPLANT
SYR CONTROL 10ML LL (SYRINGE) ×1 IMPLANT
TOWEL GREEN STERILE FF (TOWEL DISPOSABLE) ×2 IMPLANT
UNDERPAD 30X30 (UNDERPADS AND DIAPERS) ×2 IMPLANT

## 2018-03-11 NOTE — H&P (Signed)
Megan Kidd is an 63 y.o. female.   Chief Complaint: numbness left hand HPI: Megan Kidd is a 63 year old female with a history of carpal tunnel syndrome and Dupuytren's deformities. She was seen by Dr. Daylene Katayama in the past with a carpal tunnel release on her right hand back in 2015. She had nerve conductions done prior to that time revealing severe carpal tunnel on her right side lesser on her left she is now complaining of numbness and tingling in a relatively constant basis left side to her thumb to ring fingers. She has no history of injury to the hand or to the neck. She is awakened 7 out of 7 nights. States wearing a brace has helped no other activities seem to make it better or worse. She has a history of arthritis no history of diabetes thyroid problems or gout. Family history is positive diabetes arthritis negative thyroid problems and gout. She has a strong family history of Dupuytren's contracture. She does not know where her ancestors are from. She has no nodules on her feet.Her nerve conductions revealed a carpal tunnel syndrome left side with a motor delay of 5.9 sensory delay 4.7.    Past Medical History:  Diagnosis Date  . Anxiety   . Complication of anesthesia    hard to wake from general anesthesia  . Contact lens/glasses fitting    wears contacts or glasses  . Depression   . Endometriosis   . Fibroids   . Headache   . MMT (medial meniscus tear)    right knee  . Recurrent UTI     Past Surgical History:  Procedure Laterality Date  . ABDOMINAL HYSTERECTOMY  06/25/93   secondary to endometriosis  . APPENDECTOMY  1995  . BUNIONECTOMY Left 04/2011  . CARPAL TUNNEL RELEASE Right 07/07/2013   Procedure: RIGHT CARPAL TUNNEL RELEASE;  Surgeon: Cammie Sickle., MD;  Location: [facility];  Service: Orthopedics;  Laterality: Right;  . CESAREAN SECTION     x2  . CHONDROPLASTY Right 01/30/2017   Procedure: CHONDROPLASTY;  Surgeon: Ninetta Lights, MD;  Location: [facility];  Service: Orthopedics;  Laterality: Right;  . COLONOSCOPY W/ BIOPSIES  06/25/2005   colitis - recheck in 10 years  . DILATION AND CURETTAGE OF UTERUS  12/92   Hysteroscopy  . HYSTEROSCOPY  12/92  . KNEE ARTHROSCOPY WITH MEDIAL MENISECTOMY Right 01/30/2017   Procedure: RIGHT KNEE ARTHROSCOPY WITH MEDIAL MENISECTOMY;  Surgeon: Ninetta Lights, MD;  Location: [facility];  Service: Orthopedics;  Laterality: Right;  . SHOULDER ARTHROSCOPY Right 10/15/2000   with debridement, DCE and manipulation  . SHOULDER SURGERY     2202-2005 bilateral frozen shoulders  . TENNIS ELBOW RELEASE/NIRSCHEL PROCEDURE Left 01/10/2016   Procedure: LEFT TENNIS ELBOW RELEASE/DEBRIDEMENT;  Surgeon: Ninetta Lights, MD;  Location: [facility];  Service: Orthopedics;  Laterality: Left;  . torn tendon     right elbow    Family History  Problem Relation Age of Onset  . Hypertension Mother   . Osteoarthritis Mother   . Rheum arthritis Mother   . Heart failure Father   . Hypertension Father   . Lung cancer Father 48  . COPD Father   . Depression Father   . Heart disease Father        CABG X 3 in 1990 mid 60's  . Hypertension Maternal Grandfather   . Stroke Maternal Grandfather   . Parkinson's disease Maternal Grandmother   .  Depression Paternal Grandmother   . Heart failure Paternal Grandfather   . Depression Sister        X 2  . Heart failure Paternal Uncle   . Lung cancer Paternal Uncle   . Heart failure Paternal Uncle   . Breast cancer Neg Hx    Social History:  reports that she has never smoked. She has never used smokeless tobacco. She reports current alcohol use of about 1.0 standard drinks of alcohol per week. She reports that she does not use drugs.  Allergies:  Allergies  Allergen Reactions  . Ampicillin Rash  . Cephalexin Rash  . Ciprofloxacin Rash  . Levaquin [Levofloxacin In D5w] Rash    Rash around chest and neck and under breast  .  Penicillins Rash  . Septra [Sulfamethoxazole-Trimethoprim] Rash  . Zithromax [Azithromycin] Rash    No medications prior to admission.    No results found for this or any previous visit (from the past 48 hour(s)).  No results found.   Pertinent items are noted in HPI.  Height 5' 2.5" (1.588 m), weight 68.5 kg, last menstrual period 06/25/1993.  General appearance: alert, cooperative and appears stated age Head: Normocephalic, without obvious abnormality Neck: no JVD Resp: clear to auscultation bilaterally Cardio: regular rate and rhythm, S1, S2 normal, no murmur, click, rub or gallop GI: soft, non-tender; bowel sounds normal; no masses,  no organomegaly Extremities:  numbness left hand Pulses: 2+ and symmetric Skin: Skin color, texture, turgor normal. No rashes or lesions Neurologic: Grossly normal Incision/Wound: na  Assessment/Plan Assessment:  1. Carpal tunnel syndrome of left wrist    Plan: We have discussed with the possibility of injection or surgical intervention. She would like to go ahead and have this surgically corrected. Pre-peri-and postoperative course been discussed along with risks and comp occasions. She is aware there is no guarantee to the surgery the possibility of infection recurrence injury to arteries nerves tendons complete relief symptoms and dystrophy. She is scheduled for left carpal tunnel release in outpatient under regional anesthesia .     Daryll Brod 03/11/2018, 5:43 AM

## 2018-03-11 NOTE — Anesthesia Postprocedure Evaluation (Signed)
Anesthesia Post Note  Patient: Megan Kidd  Procedure(s) Performed: LEFT CARPAL TUNNEL RELEASE (Left Wrist)     Patient location during evaluation: PACU Anesthesia Type: MAC Level of consciousness: awake and alert Pain management: pain level controlled Vital Signs Assessment: post-procedure vital signs reviewed and stable Respiratory status: spontaneous breathing, nonlabored ventilation, respiratory function stable and patient connected to nasal cannula oxygen Cardiovascular status: stable and blood pressure returned to baseline Postop Assessment: no apparent nausea or vomiting Anesthetic complications: no    Last Vitals:  Vitals:   03/11/18 1115 03/11/18 1130  BP: 140/76 120/87  Pulse: (!) 48 60  Resp: 10 17  Temp:  36.5 C  SpO2: 95% 95%    Last Pain:  Vitals:   03/11/18 1130  TempSrc: Oral  PainSc: 0-No pain                 Brayla Pat

## 2018-03-11 NOTE — Anesthesia Preprocedure Evaluation (Signed)
Anesthesia Evaluation  Patient identified by MRN, date of birth, ID band Patient awake    Reviewed: Allergy & Precautions, NPO status , Patient's Chart, lab work & pertinent test results  History of Anesthesia Complications (+) PROLONGED EMERGENCE and history of anesthetic complications  Airway Mallampati: II  TM Distance: >3 FB Neck ROM: Full    Dental  (+) Dental Advisory Given   Pulmonary neg pulmonary ROS,    breath sounds clear to auscultation       Cardiovascular negative cardio ROS   Rhythm:Regular Rate:Normal     Neuro/Psych  Headaches, PSYCHIATRIC DISORDERS Anxiety Depression    GI/Hepatic negative GI ROS, Neg liver ROS,   Endo/Other  negative endocrine ROS  Renal/GU negative Renal ROS     Musculoskeletal   Abdominal   Peds  Hematology negative hematology ROS (+)   Anesthesia Other Findings   Reproductive/Obstetrics                             Anesthesia Physical  Anesthesia Plan  ASA: II  Anesthesia Plan: MAC and Bier Block and Bier Block-LIDOCAINE ONLY   Post-op Pain Management:    Induction: Intravenous  PONV Risk Score and Plan: 3 and Ondansetron, Dexamethasone, Midazolam and Treatment may vary due to age or medical condition  Airway Management Planned: Nasal Cannula and Simple Face Mask  Additional Equipment:   Intra-op Plan:   Post-operative Plan: Extubation in OR  Informed Consent: I have reviewed the patients History and Physical, chart, labs and discussed the procedure including the risks, benefits and alternatives for the proposed anesthesia with the patient or authorized representative who has indicated his/her understanding and acceptance.       Plan Discussed with: CRNA, Anesthesiologist and Surgeon  Anesthesia Plan Comments:         Anesthesia Quick Evaluation

## 2018-03-11 NOTE — Discharge Instructions (Addendum)

## 2018-03-11 NOTE — Brief Op Note (Signed)
03/11/2018  10:34 AM  PATIENT:  Megan Kidd  63 y.o. female  PRE-OPERATIVE DIAGNOSIS:  LEFT CARPAL TUNNEL SYNDROME  POST-OPERATIVE DIAGNOSIS:  LEFT CARPAL TUNNEL SYNDROME  PROCEDURE:  Procedure(s): LEFT CARPAL TUNNEL RELEASE (Left)  SURGEON:  Surgeon(s) and Role:    Daryll Brod, MD - Primary  PHYSICIAN ASSISTANT:   ASSISTANTS: none   ANESTHESIA:   local, regional and IV sedation  EBL: 22ml  BLOOD ADMINISTERED:none  DRAINS: none   LOCAL MEDICATIONS USED:  BUPIVICAINE   SPECIMEN:  No Specimen  DISPOSITION OF SPECIMEN:  N/A  COUNTS:  YES  TOURNIQUET:   Total Tourniquet Time Documented: Forearm (Left) - 16 minutes Total: Forearm (Left) - 16 minutes   DICTATION: .Viviann Spare Dictation  PLAN OF CARE: Discharge to home after PACU  PATIENT DISPOSITION:  PACU - hemodynamically stable.

## 2018-03-11 NOTE — Op Note (Signed)
NAME: Megan Kidd MEDICAL RECORD NO: [id number] DATE OF BIRTH: 1955/05/04 FACILITY: [facility] LOCATION: [facility] PHYSICIAN: Wynonia Sours, MD   OPERATIVE REPORT   DATE OF PROCEDURE: 03/11/18    PREOPERATIVE DIAGNOSIS:   Carpal tunnel syndrome left hand   POSTOPERATIVE DIAGNOSIS:   Same   PROCEDURE:   Decompression median nerve left hand   SURGEON: Daryll Brod, M.D.   ASSISTANT: none   ANESTHESIA:  Bier block with sedation and Local   INTRAVENOUS FLUIDS:  Per anesthesia flow sheet.   ESTIMATED BLOOD LOSS:  Minimal.   COMPLICATIONS:  None.   SPECIMENS:  none   TOURNIQUET TIME:    Total Tourniquet Time Documented: Forearm (Left) - 16 minutes Total: Forearm (Left) - 16 minutes    DISPOSITION:  Stable to PACU.   INDICATIONS: Is a 63 year old female with a history of carpal tunnel syndrome nerve conductions positive which has not responded to conservative treatment.  She has elected to undergo surgical decompression of the median nerve left hand.  She has had right carpal tunnel release in the past.  Pre-peri-and postoperative course been discussed along with risk complications.  She is aware that there is no guarantee to the surgery the possibility of infection recurrence injury to arteries nerves tendons complete relief of symptoms and dystrophy.  She advised is advised that we are attempting to halt the process and allow the nerve the opportunity to get better but cannot guarantee it.  Preoperative area the patient is seen the extremity marked by both patient and surgeon antibiotic given  OPERATIVE COURSE: She is brought to the operating room where form based IV regional anesthetic was carried out without difficulty under the direction the anesthesia department.  She was prepped and draped in supine position with left arm free.  A three-minute dry time was allowed timeout taken confirming patient procedure.  A longitudinal incision was made left palm carried  down through subcutaneous tissue.  Bleeders were electrocauterized with bipolar.  The palmar fascia was split.  The superficial palmar arch flexor tendon to the ring little finger identified.  Retractors were placed retracting median nerve radially ulnar nerve ulnarly.  The flexor retinaculum was then released on its ulnar border.  A right angle and saw retractor placed tween skin and forearm fascia the fascia released for approximately 2 6 to 3 cm proximal to the wrist crease under direct vision.  The canal was explored.  An persistent median artery was present.  It and not thrombosed an area compression of the nerve was apparent.  Motor branch entered the muscle distally.  The wound was copiously irrigated with saline.  The skin was closed interrupted 4-0 nylon sutures.  A local infiltration quarter percent bupivacaine without epinephrine was given.  A total of 9 cc was used.  A sterile compressive dressing with the fingers free was applied.  And deflation of the tourniquet all fingers immediately pink.  She was taken to the recovery room for observation in satisfactory condition.  She will be discharged home to return hand center of [facility] in 1 week on Tylenol ibuprofen with Ultram as a backup   Daryll Brod, MD Electronically signed, 03/11/18

## 2018-03-11 NOTE — Transfer of Care (Signed)
Immediate Anesthesia Transfer of Care Note  Patient: Megan Kidd  Procedure(s) Performed: LEFT CARPAL TUNNEL RELEASE (Left Wrist)  Patient Location: PACU  Anesthesia Type:MAC  Level of Consciousness: drowsy and patient cooperative  Airway & Oxygen Therapy: Patient Spontanous Breathing  Post-op Assessment: Report given to RN and Post -op Vital signs reviewed and stable  Post vital signs: Reviewed and stable  Last Vitals:  Vitals Value Taken Time  BP 88/62 03/11/2018 10:38 AM  Temp    Pulse 55 03/11/2018 10:40 AM  Resp 13 03/11/2018 10:40 AM  SpO2 95 % 03/11/2018 10:40 AM  Vitals shown include unvalidated device data.  Last Pain:  Vitals:   03/11/18 0945  TempSrc: Oral  PainSc: 0-No pain         Complications: No apparent anesthesia complications

## 2018-03-11 NOTE — Anesthesia Postprocedure Evaluation (Signed)
Anesthesia Post Note  Patient: Lovinia Snare  Procedure(s) Performed: LEFT CARPAL TUNNEL RELEASE (Left Wrist)     Anesthesia Type: MAC    Last Vitals:  Vitals:   03/11/18 1115 03/11/18 1130  BP: 140/76 120/87  Pulse: (!) 48 60  Resp: 10 17  Temp:  36.5 C  SpO2: 95% 95%    Last Pain:  Vitals:   03/11/18 1130  TempSrc: Oral  PainSc: 0-No pain                 Kaidence Callaway

## 2018-03-12 ENCOUNTER — Encounter (HOSPITAL_BASED_OUTPATIENT_CLINIC_OR_DEPARTMENT_OTHER): Payer: Self-pay | Admitting: Orthopedic Surgery

## 2018-03-17 DIAGNOSIS — J069 Acute upper respiratory infection, unspecified: Secondary | ICD-10-CM | POA: Diagnosis not present

## 2018-03-17 DIAGNOSIS — F419 Anxiety disorder, unspecified: Secondary | ICD-10-CM | POA: Diagnosis not present

## 2018-03-17 DIAGNOSIS — L259 Unspecified contact dermatitis, unspecified cause: Secondary | ICD-10-CM | POA: Diagnosis not present

## 2018-03-17 DIAGNOSIS — R079 Chest pain, unspecified: Secondary | ICD-10-CM | POA: Diagnosis not present

## 2018-04-09 ENCOUNTER — Ambulatory Visit: Payer: BLUE CROSS/BLUE SHIELD | Admitting: Certified Nurse Midwife

## 2018-04-09 ENCOUNTER — Other Ambulatory Visit: Payer: Self-pay

## 2018-04-09 VITALS — BP 118/68 | HR 66 | Resp 14 | Ht 62.25 in | Wt 149.0 lb

## 2018-04-09 DIAGNOSIS — N898 Other specified noninflammatory disorders of vagina: Secondary | ICD-10-CM | POA: Diagnosis not present

## 2018-04-09 DIAGNOSIS — N3001 Acute cystitis with hematuria: Secondary | ICD-10-CM | POA: Diagnosis not present

## 2018-04-09 DIAGNOSIS — R300 Dysuria: Secondary | ICD-10-CM

## 2018-04-09 LAB — POCT URINALYSIS DIPSTICK
Bilirubin, UA: NEGATIVE
Glucose, UA: NEGATIVE
KETONES UA: NEGATIVE
NITRITE UA: NEGATIVE
PROTEIN UA: NEGATIVE
Urobilinogen, UA: NEGATIVE E.U./dL — AB
pH, UA: 5 (ref 5.0–8.0)

## 2018-04-09 MED ORDER — PHENAZOPYRIDINE HCL 200 MG PO TABS: 200.0000 mg | ORAL_TABLET | Freq: Three times a day (TID) | ORAL | 0 refills | Status: DC | PRN

## 2018-04-09 MED ORDER — NITROFURANTOIN MONOHYD MACRO 100 MG PO CAPS: 100.0000 mg | ORAL_CAPSULE | Freq: Two times a day (BID) | ORAL | 0 refills | Status: DC

## 2018-04-09 NOTE — Progress Notes (Signed)
63 y.o. Married Caucasian female G2P2002 here with complaint of UTI, with onset  on early am and possibly last pm.. Patient complaining of urinary frequency/urgency/ and pain with urination. Patient denies fever or chills. Some slight nausea, some back pain. No history of kidney stones. History of UTI's with sudden onset in past and post coital. Did not take her medication, she had on hand. No blood in urine other than looks concentrated.. No new personal products. No recent sexual activity. Denies any vaginal symptoms other than vaginal dryness has Vagifem and Premarin cream for use, but has not been consistent with use. Menopausal.. Patient has not been consuming adequate water intake or fluids. 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 Leaving for [place] this pm to go on vacation. Has not eaten this am also. No other health issues today.  Review of Systems  Genitourinary: Positive for dysuria and urgency.    O: Healthy female WDWN Affect: Normal, orientation x 3 Skin : warm and dry CVAT: slight on right only bilateral Abdomen: positive for suprapubic tenderness  Pelvic exam: External genital area: normal, no lesions Bladder,Urethra tender, Urethral meatus: tender, red Vagina: normal vaginal discharge, normal appearance, some dryness noted  Cervix:surgically absent Uterus:surgically absent Adnexa: no fullness or masses   A: Acute cystitis  History of post coital UTI Inadequate fluid intake Normal pelvic exam Menopausal vaginal dryness has Vagifem for use Social stress with family  P: Reviewed findings of UTI and need for treatment. VE:LFYBOFBP 100 mg bid x 10 days due to history and traveling. See order with instructions Rx Pyridium 200 mg tid x 3 days  See order with instructions ZWC:HENID  culture Reviewed warning signs and symptoms of UTI and need to seek MD evaluation while traveling. Encouraged to limit  soda, tea, and coffee and be sure to increase water intake. Make sure she is eating food also. Questions addressed. Discussed importance of Vagifem use for dryness decrease which can increase risk of UTI.  Note: Patient feeling weak due to no intake and unable to drive safely. Spouse notified.  BP 114/74 P 80 R 18 on discharge. Patient taken to car in wheelchair with CMA in attendance with spouse to take her home. Instructions given to pick Rx and start on as discussed.  Rv prn as above   RV prn

## 2018-04-09 NOTE — Patient Instructions (Signed)
Urinary Tract Infection, Adult A urinary tract infection (UTI) is an infection of any part of the urinary tract. The urinary tract includes:  The kidneys.  The ureters.  The bladder.  The urethra. These organs make, store, and get rid of pee (urine) in the body. What are the causes? This is caused by germs (bacteria) in your genital area. These germs grow and cause swelling (inflammation) of your urinary tract. What increases the risk? You are more likely to develop this condition if:  You have a small, thin tube (catheter) to drain pee.  You cannot control when you pee or poop (incontinence).  You are female, and: ? You use these methods to prevent pregnancy: ? A medicine that kills sperm (spermicide). ? A device that blocks sperm (diaphragm). ? You have low levels of a female hormone (estrogen). ? You are pregnant.  You have genes that add to your risk.  You are sexually active.  You take antibiotic medicines.  You have trouble peeing because of: ? A prostate that is bigger than normal, if you are female. ? A blockage in the part of your body that drains pee from the bladder (urethra). ? A kidney stone. ? A nerve condition that affects your bladder (neurogenic bladder). ? Not getting enough to drink. ? Not peeing often enough.  You have other conditions, such as: ? Diabetes. ? A weak disease-fighting system (immune system). ? Sickle cell disease. ? Gout. ? Injury of the spine. What are the signs or symptoms? Symptoms of this condition include:  Needing to pee right away (urgently).  Peeing often.  Peeing small amounts often.  Pain or burning when peeing.  Blood in the pee.  Pee that smells bad or not like normal.  Trouble peeing.  Pee that is cloudy.  Fluid coming from the vagina, if you are female.  Pain in the belly or lower back. Other symptoms include:  Throwing up (vomiting).  No urge to eat.  Feeling mixed up (confused).  Being tired  and grouchy (irritable).  A fever.  Watery poop (diarrhea). How is this treated? This condition may be treated with:  Antibiotic medicine.  Other medicines.  Drinking enough water. Follow these instructions at home:  Medicines  Take over-the-counter and prescription medicines only as told by your doctor.  If you were prescribed an antibiotic medicine, take it as told by your doctor. Do not stop taking it even if you start to feel better. General instructions  Make sure you: ? Pee until your bladder is empty. ? Do not hold pee for a long time. ? Empty your bladder after sex. ? Wipe from front to back after pooping if you are a female. Use each tissue one time when you wipe.  Drink enough fluid to keep your pee pale yellow.  Keep all follow-up visits as told by your doctor. This is important. Contact a doctor if:  You do not get better after 1-2 days.  Your symptoms go away and then come back. Get help right away if:  You have very bad back pain.  You have very bad pain in your lower belly.  You have a fever.  You are sick to your stomach (nauseous).  You are throwing up. Summary  A urinary tract infection (UTI) is an infection of any part of the urinary tract.  This condition is caused by germs in your genital area.  There are many risk factors for a UTI. These include having a small, thin   tube to drain pee and not being able to control when you pee or poop.  Treatment includes antibiotic medicines for germs.  Drink enough fluid to keep your pee pale yellow. This information is not intended to replace advice given to you by your health care provider. Make sure you discuss any questions you have with your health care provider. Document Released: 07/30/2007 Document Revised: 08/20/2017 Document Reviewed: 08/20/2017 Elsevier Interactive Patient Education  2019 Elsevier Inc.  

## 2018-04-11 LAB — URINE CULTURE: Organism ID, Bacteria: NO GROWTH

## 2018-04-13 ENCOUNTER — Telehealth: Payer: Self-pay | Admitting: Certified Nurse Midwife

## 2018-04-13 NOTE — Telephone Encounter (Signed)
Spoke with patient.  She states she continues to have dysuria. Dysuria has not stopped. Using pyridium helps. Has lower back pain and lower pelvic pain.  States on Sunday she was unable to get out of bed due to pain, used pyridium and it helped some. No fevers. Able to eat and drink.  Patient currently in the [place]. Does not have access to care. No history of kidney stones. States she will be going to [place] on Saturday-Advised urgent care if not improved.  Discussed negative culture.   Advised need further instructions from [place]. Does patient need to stop Macrobid? Any further advice? Continued pain, negative urine culture.

## 2018-04-13 NOTE — Telephone Encounter (Signed)
Spoke with patient.  Message from Melvia Heaps CNM given.  Discussed message and advice.  Pt thinks Pyridium is helping, only taking one tablet per day. Advised per below provider states to stop.  She will continue with macrobid as ordered.  Advised not to wait to get care, needs evaluation now, despite location.  Discussed pushing fluids, decreasing bladder irritants.  She confirms she did not take any macrobid prior to giving urine culture.  Patient verbalizes understanding of instructions and is encouraged to seek care locally to rule out any other reasons for dysuria. Encounter closed.

## 2018-04-13 NOTE — Telephone Encounter (Signed)
Patient is taking the medication for uti and is still having symptoms.

## 2018-04-13 NOTE — Telephone Encounter (Signed)
-----   Message from Regina Eck, CNM sent at 04/11/2018  4:57 PM EST ----- Notify patient her urine culture was negative. Patient status? She was put on Macrobid due to cystitis.

## 2018-04-13 NOTE — Telephone Encounter (Signed)
If Dysuria is continuing she could have stone, due to past Micro showing crystals and she had been deficient in adequate water intake. She stop Pyridium should not use longer than 3 days. Needs to be seen in Urgent care. I would not stop Macrobid due to dysuria, even though culture is negative, until she is seen, due to lack of care issues.

## 2018-04-14 ENCOUNTER — Telehealth: Payer: Self-pay | Admitting: Certified Nurse Midwife

## 2018-04-14 NOTE — Telephone Encounter (Signed)
Spoke with patient at incoming call. She states that she has a fax number for the prescriptions that I recommended for her.  I advised that I did not recommend any medications. I stated that I gave her the brand names for the drugs she was attempting to pronounce the generic names for in our previous call (Flomax/Zofran).   I advised that she gave me the indication that if she talked with the pharmacist, she could obtain prescriptions.   I advised that I spoke with Dr. Talbert Nan personally after our last phone call and that she needs to be evaluated by a doctor wherever she is. I advised we cannot send a prescription out of the country. She states that the pharmacist there stated they get prescriptions that are from the states often. I stated again that we could not do that. She asks "are you sure?"  I apologized that she continues to have pain but that our recommendation remains the same that she needs to seek care and be evaluated. She again states she will wait until Saturday for care in [place].  Routing to Dr. Talbert Nan and Lamont Snowball, RN.  Spoke with Gay Filler after phone call with patient.

## 2018-04-14 NOTE — Telephone Encounter (Signed)
Patient is calling to give pharmacy details and would like to talk with a nurse.  FAX: [facility]

## 2018-04-14 NOTE — Telephone Encounter (Signed)
Spoke with patient at time of incoming phone call. She continues to have pressure/pain in her lower back and pelvis. Used Aleve without relief. Feeling somewhat improved, states she is pushing fluids and husband purchased heating pad for her. Denies any trauma or injury, states she knows what it is like to feel back pain and this does not feel musculoskeletal in nature.  She is asking what the doctor would recommend for her care as she can speak with the local pharmacist on the [place] she is on in the [place] to obtain a prescription. I advised that with her being out of the country we cannot give Rx or Rx instructions. Advised patient that recommendation continues to be to seek care locally. Pt again declines states [place] and does not want to do that. Has stopped Pyridium. Continues with Macrobid as per yesterdays instructions. She specifically asked about Flomax and Zofran. I again advised that neither myself or provider can make those recommendations for her care, and those medications may not be available to her from the pharmacist, again advised patient to discuss with the care provider she will be seeing while out of the country.  I expressed that I was concerned that she continues to have the pain and had a negative urine culture and still has not sought care locally. She states she will continue to monitor symptoms and still has plans to seek care on Saturday when she flies into [place]. Advised again that I do not recommend waiting for evaluation. Pt states she will call back with update. Routing to Dr. Talbert Nan.  Will close encounter.

## 2018-04-14 NOTE — Telephone Encounter (Signed)
Patient is asking to talk with a nurse about her uti and possible uti. Patient is asking for two medications if needed.

## 2018-04-30 DIAGNOSIS — G5602 Carpal tunnel syndrome, left upper limb: Secondary | ICD-10-CM | POA: Diagnosis not present

## 2018-05-07 ENCOUNTER — Telehealth: Payer: Self-pay | Admitting: Obstetrics and Gynecology

## 2018-05-07 NOTE — Telephone Encounter (Signed)
Spoke with patient, states yuvafem 10 mcg vag tab no longer covered by plan. Patient unsure if covered as Brand Vagifem. Advised RN will contact pharmacy and return call, patient agreeable.

## 2018-05-07 NOTE — Telephone Encounter (Signed)
Call returned to patient, advised as seen below. Patient will f/u with pharmacy for filling Rx as Brand Vagifem, is aware to return call if any additional assistance needed. Patient verbalizes understanding and is agreeable.   Encounter closed.

## 2018-05-07 NOTE — Telephone Encounter (Signed)
Patient calling in regards to insurance denying her medication. No open note.

## 2018-05-07 NOTE — Telephone Encounter (Addendum)
Spoke with Marjory Lies at [facility], yuvafem Rx denied, not covered by plan, no alternative provided. Was advised their system is "slow" today, unable to check RX at this time. He will run Rx as brand Vagifem, will notify office via fax if alternative still needed.

## 2018-05-25 ENCOUNTER — Other Ambulatory Visit: Payer: BLUE CROSS/BLUE SHIELD

## 2018-06-07 ENCOUNTER — Ambulatory Visit
Admission: RE | Admit: 2018-06-07 | Discharge: 2018-06-07 | Disposition: A | Discharge status: Home / Self Care | Payer: BLUE CROSS/BLUE SHIELD | Source: Ambulatory Visit | Attending: Obstetrics and Gynecology | Admitting: Obstetrics and Gynecology

## 2018-06-07 ENCOUNTER — Other Ambulatory Visit: Payer: Self-pay

## 2018-06-07 ENCOUNTER — Ambulatory Visit: Payer: BLUE CROSS/BLUE SHIELD

## 2018-06-07 DIAGNOSIS — N6489 Other specified disorders of breast: Secondary | ICD-10-CM

## 2018-09-07 ENCOUNTER — Other Ambulatory Visit: Payer: Self-pay | Admitting: Obstetrics and Gynecology

## 2018-09-07 NOTE — Telephone Encounter (Signed)
Patient is scheduled for 11/22/18

## 2018-09-07 NOTE — Telephone Encounter (Signed)
Medication refill request: Vivelle-Dot Last AEX:  08/13/17 DL Next AEX: 11/22/18 Last MMG (if hormonal medication request): 06/07/18 BIRADS 1 negative/density c Refill authorized: Please advise on refill; Order pended #24 w/0 refills

## 2018-09-07 NOTE — Telephone Encounter (Signed)
Has she had aex for the year to refill

## 2018-09-09 ENCOUNTER — Ambulatory Visit: Payer: BLUE CROSS/BLUE SHIELD | Admitting: Obstetrics and Gynecology

## 2018-11-15 ENCOUNTER — Other Ambulatory Visit: Payer: Self-pay | Admitting: Obstetrics and Gynecology

## 2018-11-15 NOTE — Telephone Encounter (Signed)
Medication refill request: Estradiol patch Last AEX:  08/13/17 JJ Next AEX: 11/22/18  Last MMG (if hormonal medication request): 06/07/18 BIRADS 1 negative/density c Refill authorized: Please advise on refill; Order pended #24 w/0 refills if authorized

## 2018-11-16 DIAGNOSIS — M7711 Lateral epicondylitis, right elbow: Secondary | ICD-10-CM | POA: Diagnosis not present

## 2018-11-16 DIAGNOSIS — M65319 Trigger thumb, unspecified thumb: Secondary | ICD-10-CM | POA: Diagnosis not present

## 2018-11-16 DIAGNOSIS — M720 Palmar fascial fibromatosis [Dupuytren]: Secondary | ICD-10-CM | POA: Diagnosis not present

## 2018-11-16 DIAGNOSIS — F419 Anxiety disorder, unspecified: Secondary | ICD-10-CM | POA: Diagnosis not present

## 2018-11-17 DIAGNOSIS — M720 Palmar fascial fibromatosis [Dupuytren]: Secondary | ICD-10-CM | POA: Insufficient documentation

## 2018-11-17 DIAGNOSIS — M65312 Trigger thumb, left thumb: Secondary | ICD-10-CM | POA: Diagnosis not present

## 2018-11-17 DIAGNOSIS — G5602 Carpal tunnel syndrome, left upper limb: Secondary | ICD-10-CM | POA: Insufficient documentation

## 2018-11-18 ENCOUNTER — Other Ambulatory Visit: Payer: Self-pay

## 2018-11-18 DIAGNOSIS — L821 Other seborrheic keratosis: Secondary | ICD-10-CM | POA: Diagnosis not present

## 2018-11-18 DIAGNOSIS — D2262 Melanocytic nevi of left upper limb, including shoulder: Secondary | ICD-10-CM | POA: Diagnosis not present

## 2018-11-18 DIAGNOSIS — L659 Nonscarring hair loss, unspecified: Secondary | ICD-10-CM | POA: Diagnosis not present

## 2018-11-18 DIAGNOSIS — D2272 Melanocytic nevi of left lower limb, including hip: Secondary | ICD-10-CM | POA: Diagnosis not present

## 2018-11-18 DIAGNOSIS — Z79899 Other long term (current) drug therapy: Secondary | ICD-10-CM | POA: Diagnosis not present

## 2018-11-18 DIAGNOSIS — D2261 Melanocytic nevi of right upper limb, including shoulder: Secondary | ICD-10-CM | POA: Diagnosis not present

## 2018-11-22 ENCOUNTER — Other Ambulatory Visit: Payer: Self-pay

## 2018-11-22 ENCOUNTER — Ambulatory Visit (INDEPENDENT_AMBULATORY_CARE_PROVIDER_SITE_OTHER): Payer: BC Managed Care – PPO | Admitting: Obstetrics and Gynecology

## 2018-11-22 ENCOUNTER — Encounter: Payer: Self-pay | Admitting: Obstetrics and Gynecology

## 2018-11-22 VITALS — BP 112/70 | HR 68 | Temp 97.3°F | Resp 12 | Ht 62.0 in | Wt 150.8 lb

## 2018-11-22 DIAGNOSIS — Z87440 Personal history of urinary (tract) infections: Secondary | ICD-10-CM | POA: Diagnosis not present

## 2018-11-22 DIAGNOSIS — Z5181 Encounter for therapeutic drug level monitoring: Secondary | ICD-10-CM

## 2018-11-22 DIAGNOSIS — Z01419 Encounter for gynecological examination (general) (routine) without abnormal findings: Secondary | ICD-10-CM | POA: Diagnosis not present

## 2018-11-22 DIAGNOSIS — N952 Postmenopausal atrophic vaginitis: Secondary | ICD-10-CM | POA: Diagnosis not present

## 2018-11-22 DIAGNOSIS — Z79890 Hormone replacement therapy (postmenopausal): Secondary | ICD-10-CM

## 2018-11-22 MED ORDER — ESTRADIOL 10 MCG VA TABS: ORAL_TABLET | VAGINAL | 3 refills | Status: DC

## 2018-11-22 MED ORDER — NITROFURANTOIN MONOHYD MACRO 100 MG PO CAPS: 100.0000 mg | ORAL_CAPSULE | ORAL | 4 refills | Status: DC | PRN

## 2018-11-22 MED ORDER — ESTRADIOL 0.025 MG/24HR TD PTTW: 1.0000 | MEDICATED_PATCH | TRANSDERMAL | 2 refills | Status: DC

## 2018-11-22 NOTE — Patient Instructions (Signed)

## 2018-11-22 NOTE — Progress Notes (Signed)
63 y.o. G48P2002 Married White or Caucasian Not Hispanic or Latino female here for annual exam.   H/O hysterectomy, on low dose ERT patch and vaginal estrogen. Intermittent vasomotor symptoms. No dyspareunia.  Also on antibiotics with intercourse to prevent UTI's.    H/O fecal staining, didn't go to PT. It hasn't happened for a while. Worse when she is stressed. Less often and tolerable.   H/O GSI, minimal and tolerable.   Still has hair loss and brittle nails, having f/u testing with her primary. She is seeing her Dermatologist and is on Spironolactone.  Patient's last menstrual period was 06/25/1993.          Sexually active: Yes.    The current method of family planning is status post hysterectomy.    Exercising: No.  The patient does not participate in regular exercise at present. Smoker:  no  Health Maintenance: Pap:  Years ago per patient WNL History of abnormal Pap:  no MMG: 06/07/18 Right Breast Diagnostic MMG - BIRADS 1 negative/density c, due now for screening.  BMD:   04/02/15 Osteopenia, T score -1.4 Colonoscopy: 2018 Normal TDaP:  06/18/15 Gardasil: n/a   reports that she has never smoked. She has never used smokeless tobacco. She reports current alcohol use of about 1.0 standard drinks of alcohol per week. She reports that she does not use drugs. 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 Typically travels a lot. Kids are in [place], has an almost 50 year year old grand daughter (daughters child). Daughter is pregnant again (lost her first child at birth, then had an SAB earlier this year). Son is getting married.   Past Medical History:  Diagnosis Date  . Anxiety   . Carpal tunnel syndrome, right   . Complication of anesthesia    hard to wake from general anesthesia  . Contact lens/glasses fitting    wears contacts or glasses  . Depression   . Endometriosis   . Fibroids   . Headache   . MMT (medial meniscus tear)    right knee  . Recurrent UTI   .  Trigger thumb of right hand     Past Surgical History:  Procedure Laterality Date  . ABDOMINAL HYSTERECTOMY  06/25/93   secondary to endometriosis  . APPENDECTOMY  1995  . BUNIONECTOMY Left 04/2011  . CARPAL TUNNEL RELEASE Right 07/07/2013   Procedure: RIGHT CARPAL TUNNEL RELEASE;  Surgeon: Cammie Sickle., MD;  Location: [facility];  Service: Orthopedics;  Laterality: Right;  . CARPAL TUNNEL RELEASE Left 03/11/2018   Procedure: LEFT CARPAL TUNNEL RELEASE;  Surgeon: Daryll Brod, MD;  Location: [facility];  Service: Orthopedics;  Laterality: Left;  . CARPAL TUNNEL RELEASE Right   . CESAREAN SECTION     x2  . CHONDROPLASTY Right 01/30/2017   Procedure: CHONDROPLASTY;  Surgeon: Ninetta Lights, MD;  Location: [facility];  Service: Orthopedics;  Laterality: Right;  . COLONOSCOPY W/ BIOPSIES  06/25/2005   colitis - recheck in 10 years  . DILATION AND CURETTAGE OF UTERUS  12/92   Hysteroscopy  . HYSTEROSCOPY  12/92  . KNEE ARTHROSCOPY WITH MEDIAL MENISECTOMY Right 01/30/2017   Procedure: RIGHT KNEE ARTHROSCOPY WITH MEDIAL MENISECTOMY;  Surgeon: Ninetta Lights, MD;  Location: [facility];  Service: Orthopedics;  Laterality: Right;  . SHOULDER ARTHROSCOPY Right 10/15/2000   with debridement, DCE and manipulation  . SHOULDER SURGERY     2202-2005 bilateral frozen shoulders  .  TENNIS ELBOW RELEASE/NIRSCHEL PROCEDURE Left 01/10/2016   Procedure: LEFT TENNIS ELBOW RELEASE/DEBRIDEMENT;  Surgeon: Ninetta Lights, MD;  Location: [facility];  Service: Orthopedics;  Laterality: Left;  . torn tendon     right elbow    Current Outpatient Medications  Medication Sig Dispense Refill  . ALPRAZolam (XANAX) 0.25 MG tablet Take 1 tablet by mouth 2 (two) times daily as needed.   2  . Ascorbic Acid (VITAMIN C PO) Take by mouth.    . B Complex Vitamins (B COMPLEX PO) Take by mouth.    . Biotin 10 MG TABS Take 1 tablet by mouth daily.     Marland Kitchen buPROPion (WELLBUTRIN XL) 150 MG 24 hr tablet Take 450 mg by mouth daily.     . calcium citrate (CALCITRATE - DOSED IN MG ELEMENTAL CALCIUM) 950 MG tablet Take 200 mg of elemental calcium by mouth daily.    . cetirizine (ZYRTEC) 10 MG tablet Take 10 mg by mouth daily.    . cholecalciferol (VITAMIN D) 1000 UNITS tablet Take 1,000 Units by mouth daily.    Marland Kitchen conjugated estrogens (PREMARIN) vaginal cream Apply a pea sized amount at the urethra prn. 30 g 0  . CRANBERRY PO Take by mouth.    . Estradiol (VAGIFEM) 10 MCG TABS vaginal tablet Use twice a week 24 tablet 4  . estradiol (VIVELLE-DOT) 0.025 MG/24HR PLACE 1 PATCH ONTO THE SKIN 2 (TWO) TIMES A WEEK. 24 patch 0  . MAGNESIUM PO Take by mouth.    . nitrofurantoin, macrocrystal-monohydrate, (MACROBID) 100 MG capsule Take 1 capsule (100 mg total) by mouth as needed (after intercourse). 30 capsule 4  . Omega-3 Fatty Acids (FISH OIL PO) Take by mouth.    . phenazopyridine (PYRIDIUM) 200 MG tablet Take 1 tablet (200 mg total) by mouth 3 (three) times daily as needed for pain. 12 tablet 0  . Probiotic Product (PROBIOTIC PO) Take by mouth.    . spironolactone (ALDACTONE) 100 MG tablet Take 100 mg by mouth daily.     . Vilazodone HCl (VIIBRYD) 40 MG TABS Take 40 mg by mouth daily.     . traMADol (ULTRAM) 50 MG tablet Take 1 tablet (50 mg total) by mouth every 6 (six) hours as needed. (Patient not taking: Reported on 11/22/2018) 20 tablet 0   No current facility-administered medications for this visit.     Family History  Problem Relation Age of Onset  . Hypertension Mother   . Osteoarthritis Mother   . Rheum arthritis Mother   . Heart failure Father   . Hypertension Father   . Lung cancer Father 24  . COPD Father   . Depression Father   . Heart disease Father        CABG X 3 in 1990 mid 60's  . Hypertension Maternal Grandfather   . Stroke Maternal Grandfather   . Parkinson's disease Maternal Grandmother   . Cancer Sister   . Depression  Sister   . Depression Paternal Grandmother   . Heart failure Paternal Grandfather   . Heart failure Paternal Uncle   . Lung cancer Paternal Uncle   . Heart failure Paternal Uncle   . Breast cancer Neg Hx     Review of Systems  Constitutional: Negative.   HENT: Negative.   Eyes: Negative.   Respiratory: Negative.   Cardiovascular: Negative.   Gastrointestinal: Negative.   Endocrine: Negative.   Genitourinary: Negative.   Musculoskeletal: Negative.   Skin: Negative.   Allergic/Immunologic: Negative.  Neurological: Negative.   Hematological: Negative.   Psychiatric/Behavioral: Negative.     Exam:   BP 112/70 (BP Location: Right Arm, Patient Position: Sitting, Cuff Size: Normal)   Pulse 68   Temp (!) 97.3 F (36.3 C) (Temporal)   Resp 12   Ht 5\' 2"  (1.575 m)   Wt 150 lb 12.8 oz (68.4 kg)   LMP 06/25/1993   BMI 27.58 kg/m   Weight change: @WEIGHTCHANGE @ Height:   Height: 5\' 2"  (157.5 cm)  Ht Readings from Last 3 Encounters:  11/22/18 5\' 2"  (1.575 m)  04/09/18 5' 2.25" (1.581 m)  03/11/18 5' 2.5" (1.588 m)    General appearance: alert, cooperative and appears stated age Head: Normocephalic, without obvious abnormality, atraumatic Neck: no adenopathy, supple, symmetrical, trachea midline and thyroid normal to inspection and palpation Lungs: clear to auscultation bilaterally Cardiovascular: regular rate and rhythm Breasts: normal appearance, no masses or tenderness Abdomen: soft, non-tender; non distended,  no masses,  no organomegaly Extremities: extremities normal, atraumatic, no cyanosis or edema Skin: Skin color, texture, turgor normal. No rashes or lesions Lymph nodes: Cervical, supraclavicular, and axillary nodes normal. No abnormal inguinal nodes palpated Neurologic: Grossly normal   Pelvic: External genitalia:  no lesions              Urethra:  normal appearing urethra with no masses, tenderness or lesions              Bartholins and Skenes: normal                  Vagina: normal appearing vagina with normal color and discharge, no lesions              Cervix: absent               Bimanual Exam:  Uterus:  uterus absent              Adnexa: no mass, fullness, tenderness               Rectovaginal: Confirms               Anus:  normal sphincter tone, no lesions  Chaperone was present for exam.  A:  Well Woman with normal exam  On ERT and vaginal estrogen, wants to continue, aware of risk  H/O recurrent UTI, on macrobid prophylaxis.  P:   No pap this year  Labs with primary  Mammogram due  Colonoscopy UTD  Will hold on the DEXA for now  Discussed breast self exam  Discussed calcium and vit D intake   Continue the low dose patch and vaginal estrogen   Macrobid with intercourse

## 2018-12-01 DIAGNOSIS — Z23 Encounter for immunization: Secondary | ICD-10-CM | POA: Diagnosis not present

## 2018-12-07 DIAGNOSIS — R7989 Other specified abnormal findings of blood chemistry: Secondary | ICD-10-CM | POA: Diagnosis not present

## 2018-12-15 DIAGNOSIS — M7711 Lateral epicondylitis, right elbow: Secondary | ICD-10-CM | POA: Diagnosis not present

## 2018-12-21 ENCOUNTER — Other Ambulatory Visit: Payer: Self-pay | Admitting: Obstetrics and Gynecology

## 2018-12-21 DIAGNOSIS — Z1231 Encounter for screening mammogram for malignant neoplasm of breast: Secondary | ICD-10-CM

## 2018-12-22 ENCOUNTER — Ambulatory Visit
Admission: RE | Admit: 2018-12-22 | Discharge: 2018-12-22 | Disposition: A | Discharge status: Home / Self Care | Payer: BC Managed Care – PPO | Source: Ambulatory Visit | Attending: Obstetrics and Gynecology | Admitting: Obstetrics and Gynecology

## 2018-12-22 ENCOUNTER — Other Ambulatory Visit: Payer: Self-pay

## 2018-12-22 DIAGNOSIS — Z1231 Encounter for screening mammogram for malignant neoplasm of breast: Secondary | ICD-10-CM | POA: Diagnosis not present

## 2019-01-17 ENCOUNTER — Other Ambulatory Visit: Payer: Self-pay | Admitting: Orthopedic Surgery

## 2019-01-17 DIAGNOSIS — M7711 Lateral epicondylitis, right elbow: Secondary | ICD-10-CM | POA: Diagnosis not present

## 2019-01-17 DIAGNOSIS — M25521 Pain in right elbow: Secondary | ICD-10-CM

## 2019-01-30 ENCOUNTER — Other Ambulatory Visit: Payer: Self-pay

## 2019-01-30 ENCOUNTER — Ambulatory Visit
Admission: RE | Admit: 2019-01-30 | Discharge: 2019-01-30 | Disposition: A | Discharge status: Home / Self Care | Payer: BC Managed Care – PPO | Source: Ambulatory Visit | Attending: Orthopedic Surgery | Admitting: Orthopedic Surgery

## 2019-01-30 DIAGNOSIS — S56511A Strain of other extensor muscle, fascia and tendon at forearm level, right arm, initial encounter: Secondary | ICD-10-CM | POA: Diagnosis not present

## 2019-01-30 DIAGNOSIS — M25521 Pain in right elbow: Secondary | ICD-10-CM

## 2019-02-04 DIAGNOSIS — M7711 Lateral epicondylitis, right elbow: Secondary | ICD-10-CM | POA: Diagnosis not present

## 2019-02-06 ENCOUNTER — Other Ambulatory Visit: Payer: BC Managed Care – PPO

## 2019-02-08 DIAGNOSIS — M7711 Lateral epicondylitis, right elbow: Secondary | ICD-10-CM | POA: Diagnosis not present

## 2019-02-08 DIAGNOSIS — R29898 Other symptoms and signs involving the musculoskeletal system: Secondary | ICD-10-CM | POA: Diagnosis not present

## 2019-02-08 DIAGNOSIS — M25632 Stiffness of left wrist, not elsewhere classified: Secondary | ICD-10-CM | POA: Diagnosis not present

## 2019-02-08 DIAGNOSIS — M25521 Pain in right elbow: Secondary | ICD-10-CM | POA: Diagnosis not present

## 2019-02-15 DIAGNOSIS — M7711 Lateral epicondylitis, right elbow: Secondary | ICD-10-CM | POA: Diagnosis not present

## 2019-02-15 DIAGNOSIS — M25632 Stiffness of left wrist, not elsewhere classified: Secondary | ICD-10-CM | POA: Diagnosis not present

## 2019-02-15 DIAGNOSIS — R29898 Other symptoms and signs involving the musculoskeletal system: Secondary | ICD-10-CM | POA: Diagnosis not present

## 2019-02-15 DIAGNOSIS — M25521 Pain in right elbow: Secondary | ICD-10-CM | POA: Diagnosis not present

## 2019-02-22 DIAGNOSIS — R29898 Other symptoms and signs involving the musculoskeletal system: Secondary | ICD-10-CM | POA: Diagnosis not present

## 2019-02-22 DIAGNOSIS — M25632 Stiffness of left wrist, not elsewhere classified: Secondary | ICD-10-CM | POA: Diagnosis not present

## 2019-02-22 DIAGNOSIS — M25521 Pain in right elbow: Secondary | ICD-10-CM | POA: Diagnosis not present

## 2019-02-22 DIAGNOSIS — M7711 Lateral epicondylitis, right elbow: Secondary | ICD-10-CM | POA: Diagnosis not present

## 2019-03-01 DIAGNOSIS — M7711 Lateral epicondylitis, right elbow: Secondary | ICD-10-CM | POA: Diagnosis not present

## 2019-03-01 DIAGNOSIS — M25521 Pain in right elbow: Secondary | ICD-10-CM | POA: Diagnosis not present

## 2019-03-01 DIAGNOSIS — R29898 Other symptoms and signs involving the musculoskeletal system: Secondary | ICD-10-CM | POA: Diagnosis not present

## 2019-03-01 DIAGNOSIS — M25632 Stiffness of left wrist, not elsewhere classified: Secondary | ICD-10-CM | POA: Diagnosis not present

## 2019-03-04 DIAGNOSIS — M25521 Pain in right elbow: Secondary | ICD-10-CM | POA: Diagnosis not present

## 2019-03-04 DIAGNOSIS — R29898 Other symptoms and signs involving the musculoskeletal system: Secondary | ICD-10-CM | POA: Diagnosis not present

## 2019-03-04 DIAGNOSIS — M25632 Stiffness of left wrist, not elsewhere classified: Secondary | ICD-10-CM | POA: Diagnosis not present

## 2019-03-04 DIAGNOSIS — M7711 Lateral epicondylitis, right elbow: Secondary | ICD-10-CM | POA: Diagnosis not present

## 2019-03-08 DIAGNOSIS — M25521 Pain in right elbow: Secondary | ICD-10-CM | POA: Diagnosis not present

## 2019-03-08 DIAGNOSIS — R29898 Other symptoms and signs involving the musculoskeletal system: Secondary | ICD-10-CM | POA: Diagnosis not present

## 2019-03-08 DIAGNOSIS — M7711 Lateral epicondylitis, right elbow: Secondary | ICD-10-CM | POA: Diagnosis not present

## 2019-03-08 DIAGNOSIS — M25632 Stiffness of left wrist, not elsewhere classified: Secondary | ICD-10-CM | POA: Diagnosis not present

## 2019-03-09 DIAGNOSIS — Z23 Encounter for immunization: Secondary | ICD-10-CM | POA: Diagnosis not present

## 2019-03-09 DIAGNOSIS — M7711 Lateral epicondylitis, right elbow: Secondary | ICD-10-CM | POA: Diagnosis not present

## 2019-03-10 DIAGNOSIS — H25013 Cortical age-related cataract, bilateral: Secondary | ICD-10-CM | POA: Diagnosis not present

## 2019-04-05 DIAGNOSIS — Z1159 Encounter for screening for other viral diseases: Secondary | ICD-10-CM | POA: Diagnosis not present

## 2019-04-05 DIAGNOSIS — M720 Palmar fascial fibromatosis [Dupuytren]: Secondary | ICD-10-CM | POA: Diagnosis not present

## 2019-04-05 DIAGNOSIS — F419 Anxiety disorder, unspecified: Secondary | ICD-10-CM | POA: Diagnosis not present

## 2019-04-05 DIAGNOSIS — M7711 Lateral epicondylitis, right elbow: Secondary | ICD-10-CM | POA: Diagnosis not present

## 2019-04-05 DIAGNOSIS — M65319 Trigger thumb, unspecified thumb: Secondary | ICD-10-CM | POA: Diagnosis not present

## 2019-04-05 DIAGNOSIS — Z0000 Encounter for general adult medical examination without abnormal findings: Secondary | ICD-10-CM | POA: Diagnosis not present

## 2019-04-05 DIAGNOSIS — Z13220 Encounter for screening for lipoid disorders: Secondary | ICD-10-CM | POA: Diagnosis not present

## 2019-04-06 DIAGNOSIS — M7711 Lateral epicondylitis, right elbow: Secondary | ICD-10-CM | POA: Diagnosis not present

## 2019-04-06 DIAGNOSIS — M65312 Trigger thumb, left thumb: Secondary | ICD-10-CM | POA: Diagnosis not present

## 2019-04-06 DIAGNOSIS — M19042 Primary osteoarthritis, left hand: Secondary | ICD-10-CM | POA: Diagnosis not present

## 2019-05-17 ENCOUNTER — Encounter: Payer: Self-pay | Admitting: Certified Nurse Midwife

## 2019-05-19 ENCOUNTER — Other Ambulatory Visit: Payer: Self-pay

## 2019-05-19 ENCOUNTER — Ambulatory Visit
Admission: RE | Admit: 2019-05-19 | Discharge: 2019-05-19 | Disposition: A | Discharge status: Home / Self Care | Payer: BC Managed Care – PPO | Source: Ambulatory Visit | Attending: Family Medicine | Admitting: Family Medicine

## 2019-05-19 ENCOUNTER — Other Ambulatory Visit: Payer: Self-pay | Admitting: Family Medicine

## 2019-05-19 DIAGNOSIS — M542 Cervicalgia: Secondary | ICD-10-CM | POA: Diagnosis not present

## 2019-05-19 DIAGNOSIS — R202 Paresthesia of skin: Secondary | ICD-10-CM

## 2019-05-19 DIAGNOSIS — L649 Androgenic alopecia, unspecified: Secondary | ICD-10-CM | POA: Diagnosis not present

## 2019-05-19 DIAGNOSIS — L720 Epidermal cyst: Secondary | ICD-10-CM | POA: Diagnosis not present

## 2019-05-23 ENCOUNTER — Other Ambulatory Visit: Payer: Self-pay | Admitting: Family Medicine

## 2019-05-23 DIAGNOSIS — R202 Paresthesia of skin: Secondary | ICD-10-CM

## 2019-05-23 DIAGNOSIS — M47812 Spondylosis without myelopathy or radiculopathy, cervical region: Secondary | ICD-10-CM

## 2019-06-01 DIAGNOSIS — M65312 Trigger thumb, left thumb: Secondary | ICD-10-CM | POA: Diagnosis not present

## 2019-06-11 ENCOUNTER — Other Ambulatory Visit: Payer: BC Managed Care – PPO

## 2019-07-04 ENCOUNTER — Ambulatory Visit
Admission: RE | Admit: 2019-07-04 | Discharge: 2019-07-04 | Disposition: A | Discharge status: Home / Self Care | Payer: BC Managed Care – PPO | Source: Ambulatory Visit | Attending: Family Medicine | Admitting: Family Medicine

## 2019-07-04 ENCOUNTER — Other Ambulatory Visit: Payer: Self-pay

## 2019-07-04 DIAGNOSIS — M47812 Spondylosis without myelopathy or radiculopathy, cervical region: Secondary | ICD-10-CM

## 2019-07-04 DIAGNOSIS — M4802 Spinal stenosis, cervical region: Secondary | ICD-10-CM | POA: Diagnosis not present

## 2019-07-04 DIAGNOSIS — R202 Paresthesia of skin: Secondary | ICD-10-CM

## 2019-07-20 DIAGNOSIS — L259 Unspecified contact dermatitis, unspecified cause: Secondary | ICD-10-CM | POA: Diagnosis not present

## 2019-07-20 DIAGNOSIS — M542 Cervicalgia: Secondary | ICD-10-CM | POA: Diagnosis not present

## 2019-07-28 DIAGNOSIS — M5412 Radiculopathy, cervical region: Secondary | ICD-10-CM | POA: Diagnosis not present

## 2019-07-28 DIAGNOSIS — M542 Cervicalgia: Secondary | ICD-10-CM | POA: Diagnosis not present

## 2019-07-28 DIAGNOSIS — M5032 Other cervical disc degeneration, mid-cervical region, unspecified level: Secondary | ICD-10-CM | POA: Diagnosis not present

## 2019-08-18 DIAGNOSIS — M5032 Other cervical disc degeneration, mid-cervical region, unspecified level: Secondary | ICD-10-CM | POA: Diagnosis not present

## 2019-08-18 DIAGNOSIS — M5412 Radiculopathy, cervical region: Secondary | ICD-10-CM | POA: Diagnosis not present

## 2019-08-18 DIAGNOSIS — M542 Cervicalgia: Secondary | ICD-10-CM | POA: Diagnosis not present

## 2019-10-21 ENCOUNTER — Other Ambulatory Visit: Payer: Self-pay | Admitting: Obstetrics and Gynecology

## 2019-10-21 DIAGNOSIS — Z5181 Encounter for therapeutic drug level monitoring: Secondary | ICD-10-CM

## 2019-10-21 DIAGNOSIS — Z79890 Hormone replacement therapy (postmenopausal): Secondary | ICD-10-CM

## 2019-10-21 NOTE — Telephone Encounter (Signed)
Medication refill request: Vagifem 17mcg and Estradiol 0.025mg   Last AEX:  11/22/18 Next AEX: 11/23/19 Last MMG (if hormonal medication request): 12/22/18  Normal  Refill authorized: 24/0

## 2019-11-12 ENCOUNTER — Other Ambulatory Visit: Payer: Self-pay | Admitting: Obstetrics and Gynecology

## 2019-11-12 DIAGNOSIS — Z5181 Encounter for therapeutic drug level monitoring: Secondary | ICD-10-CM

## 2019-11-12 DIAGNOSIS — Z79890 Hormone replacement therapy (postmenopausal): Secondary | ICD-10-CM

## 2019-11-14 NOTE — Telephone Encounter (Signed)
Medication refill request: Vagifem 10mg   Last AEX:  11/22/18 Next AEX: 12/05/19 Last MMG (if hormonal medication request): 12/22/18 Neg Refill authorized: 8/2

## 2019-11-23 ENCOUNTER — Ambulatory Visit: Payer: BC Managed Care – PPO | Admitting: Obstetrics and Gynecology

## 2019-12-05 ENCOUNTER — Ambulatory Visit (INDEPENDENT_AMBULATORY_CARE_PROVIDER_SITE_OTHER): Payer: Medicare Other | Admitting: Obstetrics and Gynecology

## 2019-12-05 ENCOUNTER — Encounter: Payer: Self-pay | Admitting: Obstetrics and Gynecology

## 2019-12-05 ENCOUNTER — Other Ambulatory Visit: Payer: Self-pay

## 2019-12-05 VITALS — BP 110/68 | HR 79 | Ht 62.0 in | Wt 163.0 lb

## 2019-12-05 DIAGNOSIS — Z5181 Encounter for therapeutic drug level monitoring: Secondary | ICD-10-CM | POA: Diagnosis not present

## 2019-12-05 DIAGNOSIS — Z780 Asymptomatic menopausal state: Secondary | ICD-10-CM

## 2019-12-05 DIAGNOSIS — N952 Postmenopausal atrophic vaginitis: Secondary | ICD-10-CM | POA: Diagnosis not present

## 2019-12-05 DIAGNOSIS — Z87440 Personal history of urinary (tract) infections: Secondary | ICD-10-CM | POA: Diagnosis not present

## 2019-12-05 DIAGNOSIS — Z01419 Encounter for gynecological examination (general) (routine) without abnormal findings: Secondary | ICD-10-CM

## 2019-12-05 DIAGNOSIS — M8580 Other specified disorders of bone density and structure, unspecified site: Secondary | ICD-10-CM | POA: Diagnosis not present

## 2019-12-05 DIAGNOSIS — Z79890 Hormone replacement therapy (postmenopausal): Secondary | ICD-10-CM

## 2019-12-05 MED ORDER — ESTRADIOL 10 MCG VA TABS: ORAL_TABLET | VAGINAL | 3 refills | Status: DC

## 2019-12-05 MED ORDER — ESTRADIOL 0.025 MG/24HR TD PTTW: 1.0000 | MEDICATED_PATCH | TRANSDERMAL | 3 refills | Status: DC

## 2019-12-05 MED ORDER — NITROFURANTOIN MONOHYD MACRO 100 MG PO CAPS: 100.0000 mg | ORAL_CAPSULE | ORAL | 4 refills | Status: DC | PRN

## 2019-12-05 NOTE — Progress Notes (Signed)
64 y.o. G36P2002 Married White or Caucasian Not Hispanic or Latino female here for annual exam.    H/O hysterectomy, on low dose ERT patch and vaginal estrogen. She wants to continue. Has tolerable vasomotor symptoms. Sexually active, uses macrobid prophylaxis secondary to recurrent UTI's.  H/o mild GSI, rare and tolerable.   Husband diagnosed with pancreatic cancer this year. Had a whipple procedure. Can't do chemotherapy secondary to low platelets. Hematology is evaluating him for ITP. Hopes to resume chemo.    Patient's last menstrual period was 06/25/1993.          Sexually active: Yes.    The current method of family planning is status post hysterectomy.    Exercising: No.   Smoker:  no  Health Maintenance: Pap:  Years ago per patient  History of abnormal Pap:  no MMG:  12/22/18 density C Bi-rads 1 neg  BMD:   04/02/15 Osteopenia, T score -1.4,  Colonoscopy: 2018 normal  TDaP:  06/18/15  Gardasil: NA   reports that she has never smoked. She has never used smokeless tobacco. She reports current alcohol use of about 1.0 standard drink of alcohol per week. She reports that she does not use drugs. Daughter has 2 kids. Son is married.  Past Medical History:  Diagnosis Date  . Anxiety   . Carpal tunnel syndrome, right   . Complication of anesthesia    hard to wake from general anesthesia  . Contact lens/glasses fitting    wears contacts or glasses  . Depression   . Endometriosis   . Fibroids   . Headache   . MMT (medial meniscus tear)    right knee  . Recurrent UTI   . Trigger thumb of right hand     Past Surgical History:  Procedure Laterality Date  . ABDOMINAL HYSTERECTOMY  06/25/93   secondary to endometriosis  . APPENDECTOMY  1995  . BUNIONECTOMY Left 04/2011  . CARPAL TUNNEL RELEASE Right 07/07/2013   Procedure: RIGHT CARPAL TUNNEL RELEASE;  Surgeon: Cammie Sickle., MD;  Location: [facility];  Service: Orthopedics;  Laterality: Right;  . CARPAL  TUNNEL RELEASE Left 03/11/2018   Procedure: LEFT CARPAL TUNNEL RELEASE;  Surgeon: Daryll Brod, MD;  Location: [facility];  Service: Orthopedics;  Laterality: Left;  . CARPAL TUNNEL RELEASE Right   . CESAREAN SECTION     x2  . CHONDROPLASTY Right 01/30/2017   Procedure: CHONDROPLASTY;  Surgeon: Ninetta Lights, MD;  Location: [facility];  Service: Orthopedics;  Laterality: Right;  . COLONOSCOPY W/ BIOPSIES  06/25/2005   colitis - recheck in 10 years  . DILATION AND CURETTAGE OF UTERUS  12/92   Hysteroscopy  . HYSTEROSCOPY  12/92  . KNEE ARTHROSCOPY WITH MEDIAL MENISECTOMY Right 01/30/2017   Procedure: RIGHT KNEE ARTHROSCOPY WITH MEDIAL MENISECTOMY;  Surgeon: Ninetta Lights, MD;  Location: [facility];  Service: Orthopedics;  Laterality: Right;  . SHOULDER ARTHROSCOPY Right 10/15/2000   with debridement, DCE and manipulation  . SHOULDER SURGERY     2202-2005 bilateral frozen shoulders  . TENNIS ELBOW RELEASE/NIRSCHEL PROCEDURE Left 01/10/2016   Procedure: LEFT TENNIS ELBOW RELEASE/DEBRIDEMENT;  Surgeon: Ninetta Lights, MD;  Location: [facility];  Service: Orthopedics;  Laterality: Left;  . torn tendon     right elbow    Current Outpatient Medications  Medication Sig Dispense Refill  . ALPRAZolam (XANAX) 0.25 MG tablet Take 1 tablet by mouth 2 (two) times daily  as needed.   2  . Ascorbic Acid (VITAMIN C PO) Take by mouth.    . B Complex Vitamins (B COMPLEX PO) Take by mouth.    Marland Kitchen buPROPion (WELLBUTRIN XL) 150 MG 24 hr tablet Take 450 mg by mouth daily.     . calcium citrate (CALCITRATE - DOSED IN MG ELEMENTAL CALCIUM) 950 MG tablet Take 200 mg of elemental calcium by mouth daily.    . cetirizine (ZYRTEC) 10 MG tablet Take 10 mg by mouth daily.    . cholecalciferol (VITAMIN D) 1000 UNITS tablet Take 1,000 Units by mouth daily.    Marland Kitchen CRANBERRY PO Take by mouth.    . Estradiol (VAGIFEM) 10 MCG TABS vaginal tablet USE VAGINALLY TWICE  A WEEK 8 tablet 0  . estradiol (VIVELLE-DOT) 0.025 MG/24HR PLACE 1 PATCH ONTO THE SKIN 2 (TWO) TIMES A WEEK. 24 patch 0  . MAGNESIUM PO Take by mouth.    . nitrofurantoin, macrocrystal-monohydrate, (MACROBID) 100 MG capsule Take 1 capsule (100 mg total) by mouth as needed (after intercourse). 30 capsule 4  . Omega-3 Fatty Acids (FISH OIL PO) Take by mouth.    . phenazopyridine (PYRIDIUM) 200 MG tablet Take 1 tablet (200 mg total) by mouth 3 (three) times daily as needed for pain. 12 tablet 0  . Probiotic Product (PROBIOTIC PO) Take by mouth.    . spironolactone (ALDACTONE) 100 MG tablet Take 100 mg by mouth daily.     . Vilazodone HCl (VIIBRYD) 40 MG TABS Take 40 mg by mouth daily.     . Biotin 10 MG TABS Take 1 tablet by mouth daily.     No current facility-administered medications for this visit.    Family History  Problem Relation Age of Onset  . Hypertension Mother   . Osteoarthritis Mother   . Rheum arthritis Mother   . Heart failure Father   . Hypertension Father   . Lung cancer Father 24  . COPD Father   . Depression Father   . Heart disease Father        CABG X 3 in 1990 mid 60's  . Hypertension Maternal Grandfather   . Stroke Maternal Grandfather   . Parkinson's disease Maternal Grandmother   . Cancer Sister   . Depression Sister   . Depression Paternal Grandmother   . Heart failure Paternal Grandfather   . Heart failure Paternal Uncle   . Lung cancer Paternal Uncle   . Heart failure Paternal Uncle   . Breast cancer Neg Hx     Review of Systems  Exam:   BP 110/68   Pulse 79   Ht 5\' 2"  (1.575 m)   Wt 163 lb (73.9 kg)   LMP 06/25/1993   SpO2 99%   BMI 29.81 kg/m   Weight change: @WEIGHTCHANGE @ Height:   Height: 5\' 2"  (157.5 cm)  Ht Readings from Last 3 Encounters:  12/05/19 5\' 2"  (1.575 m)  11/22/18 5\' 2"  (1.575 m)  04/09/18 5' 2.25" (1.581 m)    General appearance: alert, cooperative and appears stated age Breasts: normal appearance, no masses or  tenderness Abdomen: soft, non-tender; non distended,  no masses,  no organomegaly   Pelvic: External genitalia:  no lesions              Urethra:  normal appearing urethra with no masses, tenderness or lesions              Bartholins and Skenes: normal  Vagina: normal appearing vagina with normal color and discharge, no lesions              Cervix: absent               Bimanual Exam:  Uterus:  uterus absent              Adnexa: no mass, fullness, tenderness               Rectovaginal: Confirms               Anus:  normal sphincter tone, no lesions  Shanon Petty chaperoned for the exam.  A:  Well Woman with normal exam  H/O hysterectomy, on low dose ERT and vaginal estrogen  Recurrent UTI's, takes macrobid prophylaxis with intercourse.  Osteopenia    P:   No pap needed  Mammogram due this month, she will schedule.  Colonoscopy UTD  Wants to continue ERT  Discussed breast self exam  Discussed calcium and vit D intake  DEXA ordered

## 2019-12-05 NOTE — Patient Instructions (Addendum)
EXERCISE AND DIET:  We recommended that you start or continue a regular exercise program for good health. Regular exercise means any activity that makes your heart beat faster and makes you sweat.  We recommend exercising at least 30 minutes per day at least 3 days a week, preferably 4 or 5.  We also recommend a diet low in fat and sugar.  Inactivity, poor dietary choices and obesity can cause diabetes, heart attack, stroke, and kidney damage, among others.    ALCOHOL AND SMOKING:  Women should limit their alcohol intake to no more than 7 drinks/beers/glasses of wine (combined, not each!) per week. Moderation of alcohol intake to this level decreases your risk of breast cancer and liver damage. And of course, no recreational drugs are part of a healthy lifestyle.  And absolutely no smoking or even second hand smoke. Most people know smoking can cause heart and lung diseases, but did you know it also contributes to weakening of your bones? Aging of your skin?  Yellowing of your teeth and nails?  CALCIUM AND VITAMIN D:  Adequate intake of calcium and Vitamin D are recommended.  The recommendations for exact amounts of these supplements seem to change often, but generally speaking 1,200 mg of calcium (between diet and supplement) and 800 units of Vitamin D per day seems prudent. Certain women may benefit from higher intake of Vitamin D.  If you are among these women, your doctor will have told you during your visit.    PAP SMEARS:  Pap smears, to check for cervical cancer or precancers,  have traditionally been done yearly, although recent scientific advances have shown that most women can have pap smears less often.  However, every woman still should have a physical exam from her gynecologist every year. It will include a breast check, inspection of the vulva and vagina to check for abnormal growths or skin changes, a visual exam of the cervix, and then an exam to evaluate the size and shape of the uterus and  ovaries.  And after 65 years of age, a rectal exam is indicated to check for rectal cancers. We will also provide age appropriate advice regarding health maintenance, like when you should have certain vaccines, screening for sexually transmitted diseases, bone density testing, colonoscopy, mammograms, etc.   MAMMOGRAMS:  All women over 40 years old should have a yearly mammogram. Many facilities now offer a "3D" mammogram, which may cost around $50 extra out of pocket. If possible,  we recommend you accept the option to have the 3D mammogram performed.  It both reduces the number of women who will be called back for extra views which then turn out to be normal, and it is better than the routine mammogram at detecting truly abnormal areas.    COLON CANCER SCREENING: Now recommend starting at age 45. At this time colonoscopy is not covered for routine screening until 50. There are take home tests that can be done between 45-49.   COLONOSCOPY:  Colonoscopy to screen for colon cancer is recommended for all women at age 50.  We know, you hate the idea of the prep.  We agree, BUT, having colon cancer and not knowing it is worse!!  Colon cancer so often starts as a polyp that can be seen and removed at colonscopy, which can quite literally save your life!  And if your first colonoscopy is normal and you have no family history of colon cancer, most women don't have to have it again for   10 years.  Once every ten years, you can do something that may end up saving your life, right?  We will be happy to help you get it scheduled when you are ready.  Be sure to check your insurance coverage so you understand how much it will cost.  It may be covered as a preventative service at no cost, but you should check your particular policy.      Breast Self-Awareness Breast self-awareness means being familiar with how your breasts look and feel. It involves checking your breasts regularly and reporting any changes to your  health care provider. Practicing breast self-awareness is important. A change in your breasts can be a sign of a serious medical problem. Being familiar with how your breasts look and feel allows you to find any problems early, when treatment is more likely to be successful. All women should practice breast self-awareness, including women who have had breast implants. How to do a breast self-exam One way to learn what is normal for your breasts and whether your breasts are changing is to do a breast self-exam. To do a breast self-exam: Look for Changes  1. Remove all the clothing above your waist. 2. Stand in front of a mirror in a room with good lighting. 3. Put your hands on your hips. 4. Push your hands firmly downward. 5. Compare your breasts in the mirror. Look for differences between them (asymmetry), such as: ? Differences in shape. ? Differences in size. ? Puckers, dips, and bumps in one breast and not the other. 6. Look at each breast for changes in your skin, such as: ? Redness. ? Scaly areas. 7. Look for changes in your nipples, such as: ? Discharge. ? Bleeding. ? Dimpling. ? Redness. ? A change in position. Feel for Changes Carefully feel your breasts for lumps and changes. It is best to do this while lying on your back on the floor and again while sitting or standing in the shower or tub with soapy water on your skin. Feel each breast in the following way:  Place the arm on the side of the breast you are examining above your head.  Feel your breast with the other hand.  Start in the nipple area and make  inch (2 cm) overlapping circles to feel your breast. Use the pads of your three middle fingers to do this. Apply light pressure, then medium pressure, then firm pressure. The light pressure will allow you to feel the tissue closest to the skin. The medium pressure will allow you to feel the tissue that is a little deeper. The firm pressure will allow you to feel the tissue  close to the ribs.  Continue the overlapping circles, moving downward over the breast until you feel your ribs below your breast.  Move one finger-width toward the center of the body. Continue to use the  inch (2 cm) overlapping circles to feel your breast as you move slowly up toward your collarbone.  Continue the up and down exam using all three pressures until you reach your armpit.  Write Down What You Find  Write down what is normal for each breast and any changes that you find. Keep a written record with breast changes or normal findings for each breast. By writing this information down, you do not need to depend only on memory for size, tenderness, or location. Write down where you are in your menstrual cycle, if you are still menstruating. If you are having trouble noticing differences   in your breasts, do not get discouraged. With time you will become more familiar with the variations in your breasts and more comfortable with the exam. How often should I examine my breasts? Examine your breasts every month. If you are breastfeeding, the best time to examine your breasts is after a feeding or after using a breast pump. If you menstruate, the best time to examine your breasts is 5-7 days after your period is over. During your period, your breasts are lumpier, and it may be more difficult to notice changes. When should I see my health care provider? See your health care provider if you notice:  A change in shape or size of your breasts or nipples.  A change in the skin of your breast or nipples, such as a reddened or scaly area.  Unusual discharge from your nipples.  A lump or thick area that was not there before.  Pain in your breasts.  Anything that concerns you.  Atrophic Vaginitis  Atrophic vaginitis is a condition in which the tissues that line the vagina become dry and thin. This condition is most common in women who have stopped having regular menstrual periods (are in  menopause). This usually starts when a woman is 79-12 years old. That is the time when a woman's estrogen levels begin to drop (decrease). Estrogen is a female hormone. It helps to keep the tissues of the vagina moist. It stimulates the vagina to produce a clear fluid that lubricates the vagina for sexual intercourse. This fluid also protects the vagina from infection. Lack of estrogen can cause the lining of the vagina to get thinner and dryer. The vagina may also shrink in size. It may become less elastic. Atrophic vaginitis tends to get worse over time as a woman's estrogen level drops. What are the causes? This condition is caused by the normal drop in estrogen that happens around the time of menopause. What increases the risk? Certain conditions or situations may lower a woman's estrogen level, leading to a higher risk for atrophic vaginitis. You are more likely to develop this condition if:  You are taking medicines that block estrogen.  You have had your ovaries removed.  You are being treated for cancer with X-ray (radiation) or medicines (chemotherapy).  You have given birth or are breastfeeding.  You are older than age 20.  You smoke. What are the signs or symptoms? Symptoms of this condition include:  Pain, soreness, or bleeding during sexual intercourse (dyspareunia).  Vaginal burning, irritation, or itching.  Pain or bleeding when a speculum is used in a vaginal exam (pelvic exam).  Having burning pain when passing urine.  Vaginal discharge that is brown or yellow. In some cases, there are no symptoms. How is this diagnosed? This condition is diagnosed by taking a medical history and doing a physical exam. This will include a pelvic exam that checks the vaginal tissues. Though rare, you may also have other tests, including:  A urine test.  A test that checks the acid balance in your vagina (acid balance test). How is this treated? Treatment for this condition  depends on how severe your symptoms are. Treatment may include:  Using an over-the-counter vaginal lubricant before sex.  Using a long-acting vaginal moisturizer.  Using low-dose vaginal estrogen for moderate to severe symptoms that do not respond to other treatments. Options include creams, tablets, and inserts (vaginal rings). Before you use a vaginal estrogen, tell your health care provider if you have a history of: ?  Breast cancer. ? Endometrial cancer. ? Blood clots. If you are not sexually active and your symptoms are very mild, you may not need treatment. Follow these instructions at home: Medicines  Take over-the-counter and prescription medicines only as told by your health care provider. Do not use herbal or alternative medicines unless your health care provider says that you can.  Use over-the-counter creams, lubricants, or moisturizers for dryness only as directed by your health care provider. General instructions  If your atrophic vaginitis is caused by menopause, discuss all of your menopause symptoms and treatment options with your health care provider.  Do not douche.  Do not use products that can make your vagina dry. These include: ? Scented feminine sprays. ? Scented tampons. ? Scented soaps.  Vaginal intercourse can help to improve blood flow and elasticity of vaginal tissue. If it hurts to have sex, try using a lubricant or moisturizer just before having intercourse. Contact a health care provider if:  Your discharge looks different than normal.  Your vagina has an unusual smell.  You have new symptoms.  Your symptoms do not improve with treatment.  Your symptoms get worse. Summary  Atrophic vaginitis is a condition in which the tissues that line the vagina become dry and thin. It is most common in women who have stopped having regular menstrual periods (are in menopause).  Treatment options include using vaginal lubricants and low-dose vaginal  estrogen.  Contact a health care provider if your vagina has an unusual smell, or if your symptoms get worse or do not improve after treatment. This information is not intended to replace advice given to you by your health care provider. Make sure you discuss any questions you have with your health care provider. Document Revised: 01/23/2017 Document Reviewed: 11/06/2016 Elsevier Patient Education  [employer].  Osteopenia  Osteopenia is a loss of thickness (density) inside of the bones. Another name for osteopenia is low bone mass. Mild osteopenia is a normal part of aging. It is not a disease, and it does not cause symptoms. However, if you have osteopenia and continue to lose bone mass, you could develop a condition that causes the bones to become thin and break more easily (osteoporosis). You may also lose some height, have back pain, and have a stooped posture. Although osteopenia is not a disease, making changes to your lifestyle and diet can help to prevent osteopenia from developing into osteoporosis. What are the causes? Osteopenia is caused by loss of calcium in the bones.  Bones are constantly changing. Old bone cells are continually being replaced with new bone cells. This process builds new bone. The mineral calcium is needed to build new bone and maintain bone density. Bone density is usually highest around age 88. After that, most people's bodies cannot replace all the bone they have lost with new bone. What increases the risk? You are more likely to develop this condition if:  You are older than age 27.  You are a woman who went through menopause early.  You have a long illness that keeps you in bed.  You do not get enough exercise.  You lack certain nutrients (malnutrition).  You have an overactive thyroid gland (hyperthyroidism).  You smoke.  You drink a lot of alcohol.  You are taking medicines that weaken the bones, such as steroids. What are the signs or  symptoms? This condition does not cause any symptoms. You may have a slightly higher risk for bone breaks (fractures), so getting  fractures more easily than normal may be an indication of osteopenia. How is this diagnosed? Your health care provider can diagnose this condition with a special type of X-ray exam that measures bone density (dual-energy X-ray absorptiometry, DEXA). This test can measure bone density in your hips, spine, and wrists. Osteopenia has no symptoms, so this condition is usually diagnosed after a routine bone density screening test is done for osteoporosis. This routine screening is usually done for:  Women who are age 84 or older.  Men who are age 88 or older. If you have risk factors for osteopenia, you may have the screening test at an earlier age. How is this treated? Making dietary and lifestyle changes can lower your risk for osteoporosis. If you have severe osteopenia that is close to becoming osteoporosis, your health care provider may prescribe medicines and dietary supplements such as calcium and vitamin D. These supplements help to rebuild bone density. Follow these instructions at home:   Take over-the-counter and prescription medicines only as told by your health care provider. These include vitamins and supplements.  Eat a diet that is high in calcium and vitamin D. ? Calcium is found in dairy products, beans, salmon, and leafy green vegetables like spinach and broccoli. ? Look for foods that have vitamin D and calcium added to them (fortified foods), such as orange juice, cereal, and bread.  Do 30 or more minutes of a weight-bearing exercise every day, such as walking, jogging, or playing a sport. These types of exercises strengthen the bones.  Take precautions at home to lower your risk of falling, such as: ? Keeping rooms well-lit and free of clutter, such as cords. ? Installing safety rails on stairs. ? Using rubber mats in the bathroom or other areas  that are often wet or slippery.  Do not use any products that contain nicotine or tobacco, such as cigarettes and e-cigarettes. If you need help quitting, ask your health care provider.  Avoid alcohol or limit alcohol intake to no more than 1 drink a day for nonpregnant women and 2 drinks a day for men. One drink equals 12 oz of beer, 5 oz of wine, or 1 oz of hard liquor.  Keep all follow-up visits as told by your health care provider. This is important. Contact a health care provider if:  You have not had a bone density screening for osteoporosis and you are: ? A woman, age 42 or older. ? A man, age 45 or older.  You are a postmenopausal woman who has not had a bone density screening for osteoporosis.  You are older than age 65 and you want to know if you should have bone density screening for osteoporosis. Summary  Osteopenia is a loss of thickness (density) inside of the bones. Another name for osteopenia is low bone mass.  Osteopenia is not a disease, but it may increase your risk for a condition that causes the bones to become thin and break more easily (osteoporosis).  You may be at risk for osteopenia if you are older than age 52 or if you are a woman who went through early menopause.  Osteopenia does not cause any symptoms, but it can be diagnosed with a bone density screening test.  Dietary and lifestyle changes are the first treatment for osteopenia. These may lower your risk for osteoporosis. This information is not intended to replace advice given to you by your health care provider. Make sure you discuss any questions you have with your  health care provider. Document Revised: 01/23/2017 Document Reviewed: 11/19/2016 Elsevier Patient Education  2020 [employer].

## 2020-02-06 ENCOUNTER — Other Ambulatory Visit: Payer: Self-pay | Admitting: Obstetrics and Gynecology

## 2020-02-06 DIAGNOSIS — Z1231 Encounter for screening mammogram for malignant neoplasm of breast: Secondary | ICD-10-CM

## 2020-03-22 ENCOUNTER — Ambulatory Visit
Admission: RE | Admit: 2020-03-22 | Discharge: 2020-03-22 | Disposition: A | Discharge status: Home / Self Care | Payer: Medicare Other | Source: Ambulatory Visit | Attending: Obstetrics and Gynecology | Admitting: Obstetrics and Gynecology

## 2020-03-22 ENCOUNTER — Other Ambulatory Visit: Payer: Self-pay

## 2020-03-22 DIAGNOSIS — Z1231 Encounter for screening mammogram for malignant neoplasm of breast: Secondary | ICD-10-CM

## 2020-05-12 IMAGING — MG DIGITAL SCREENING BILAT W/ TOMO
2 series · 3 of 6 positions shown · non-contrast
Comparison: Previous exam(s).

CLINICAL DATA: Screening.

EXAM:
DIGITAL SCREENING BILATERAL MAMMOGRAM WITH TOMO AND CAD

[L MLO synth-2D]
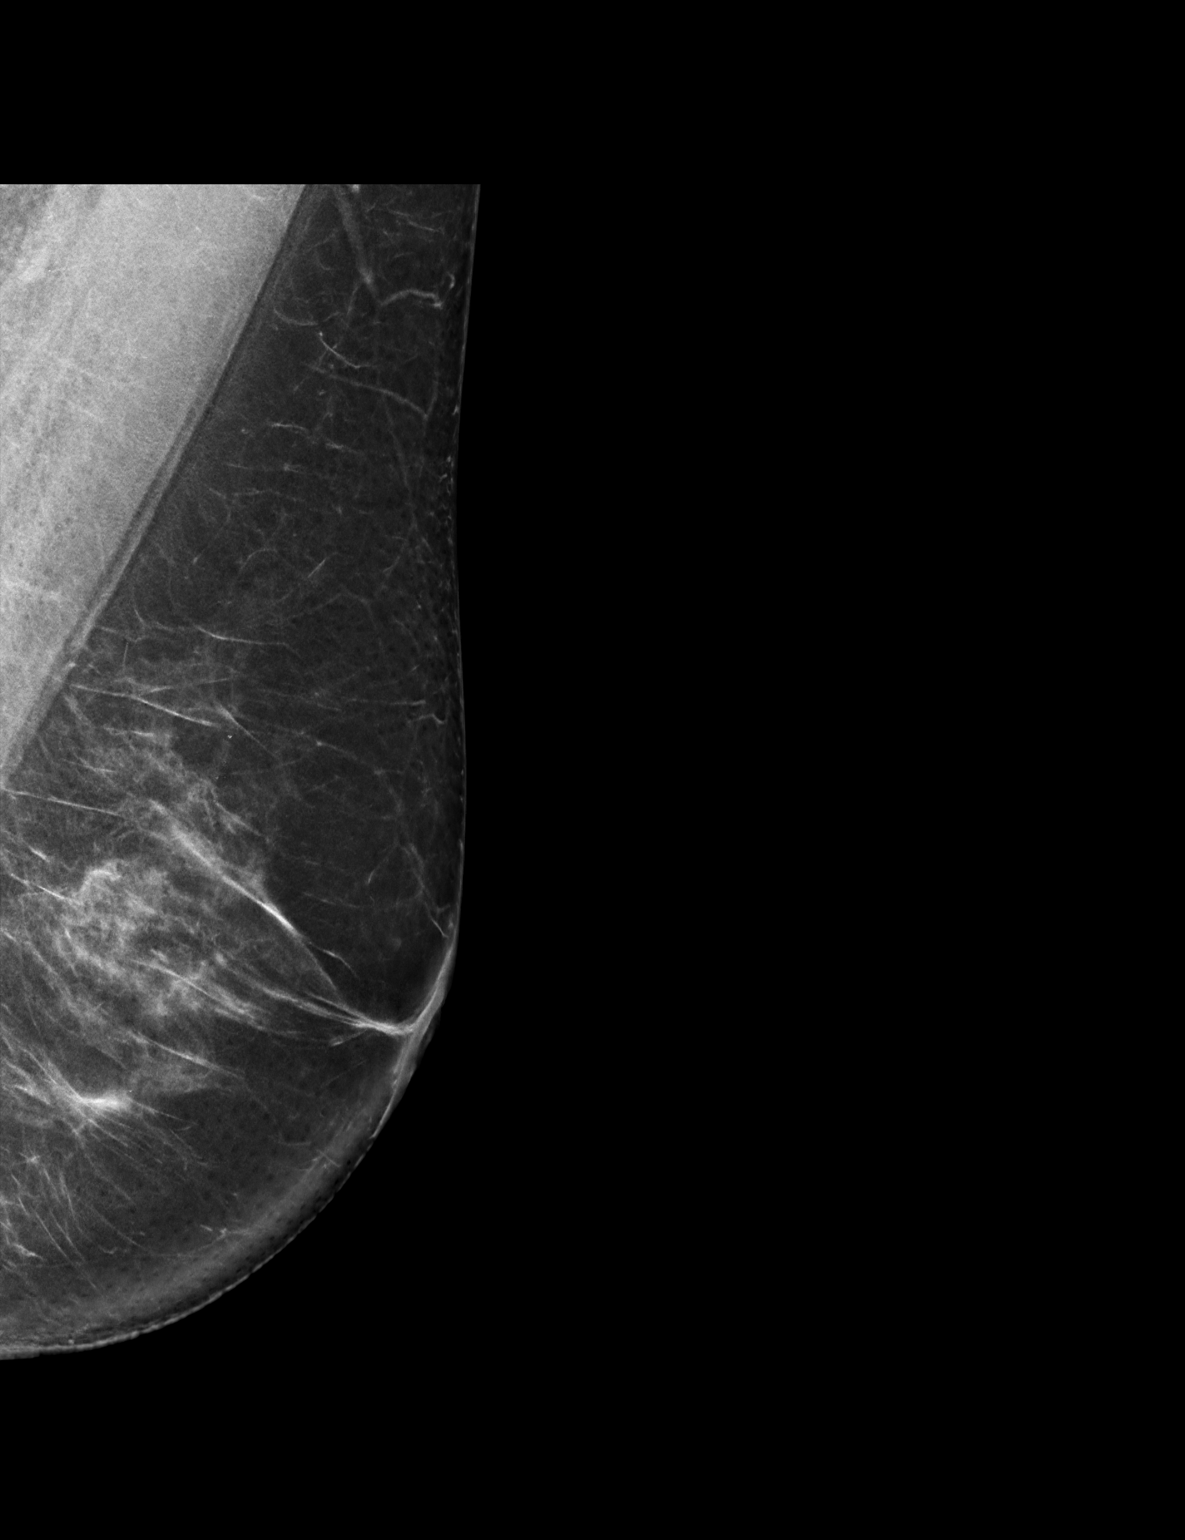

[L MLO tomo · 2 of 73 frames shown]
[frame 24/73]
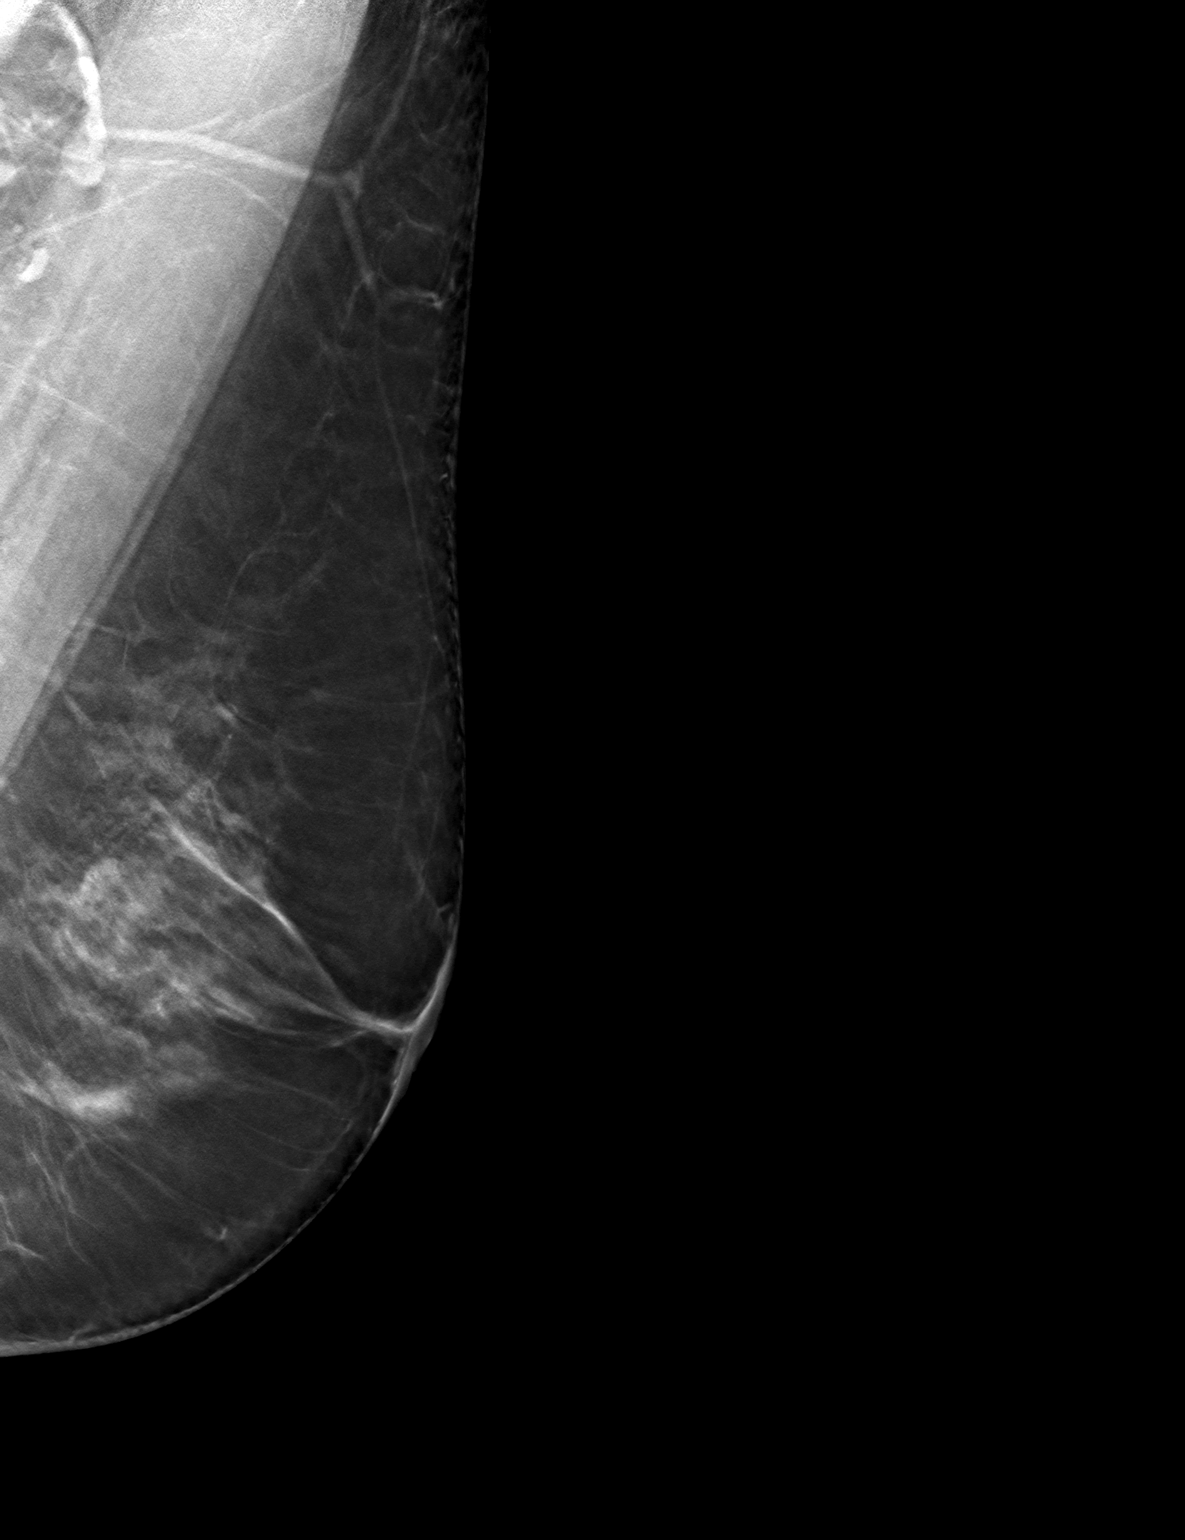
[frame 37/73]
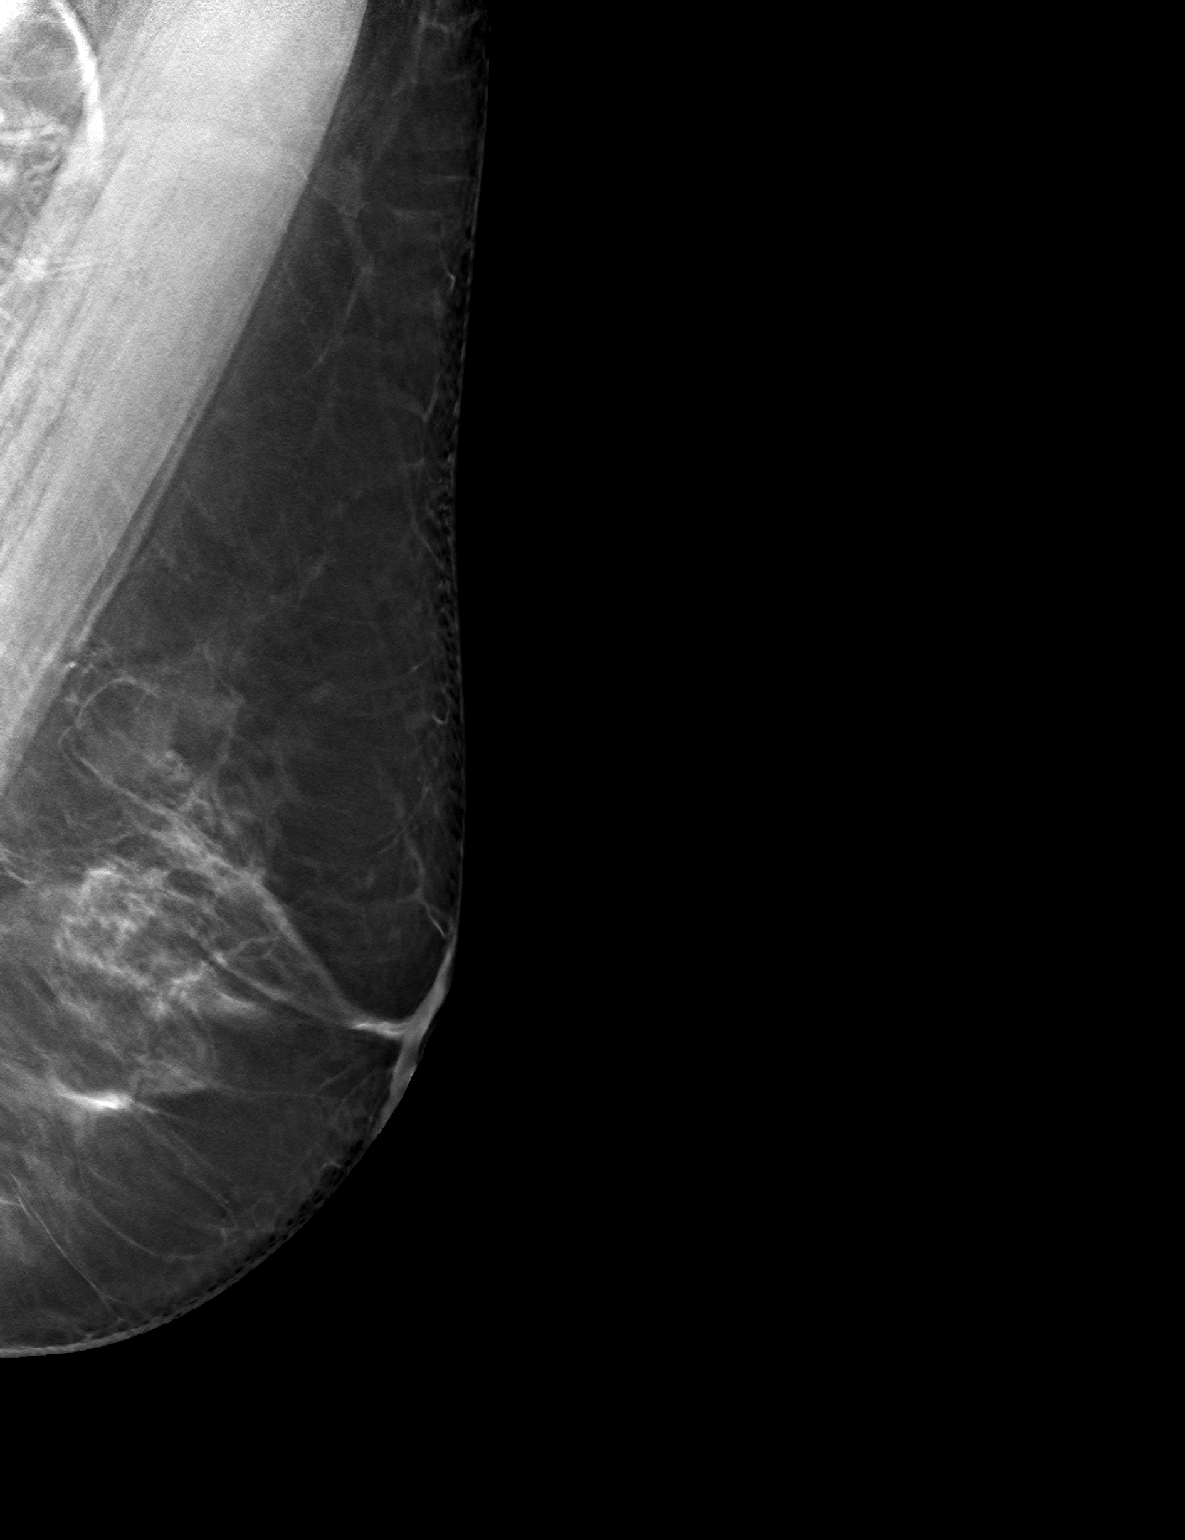

[3 of 6 positions shown; findings below may reference images not displayed]

ACR Breast Density Category c: The breast tissue is heterogeneously
dense, which may obscure small masses.
FINDINGS: There are no findings suspicious for malignancy. Images were
processed with CAD.
IMPRESSION: No mammographic evidence of malignancy. A result letter of this
screening mammogram will be mailed directly to the patient.

RECOMMENDATION:
Screening mammogram in one year. (Code:[id number])

BI-RADS CATEGORY  1: Negative.

## 2020-09-11 ENCOUNTER — Other Ambulatory Visit: Payer: Self-pay

## 2020-09-11 ENCOUNTER — Ambulatory Visit
Admission: RE | Admit: 2020-09-11 | Discharge: 2020-09-11 | Disposition: A | Discharge status: Home / Self Care | Payer: Medicare Other | Source: Ambulatory Visit | Attending: Obstetrics and Gynecology | Admitting: Obstetrics and Gynecology

## 2020-09-11 DIAGNOSIS — Z780 Asymptomatic menopausal state: Secondary | ICD-10-CM

## 2020-09-11 DIAGNOSIS — M8580 Other specified disorders of bone density and structure, unspecified site: Secondary | ICD-10-CM

## 2020-10-07 IMAGING — CR DG CERVICAL SPINE COMPLETE 4+V
5 series · 5 of 5 positions shown · non-contrast
Comparison: None.

CLINICAL DATA: 63-year-old female with chronic neck pain.

EXAM:
CERVICAL SPINE - COMPLETE 4+ VIEW

[w cervical spine lat]
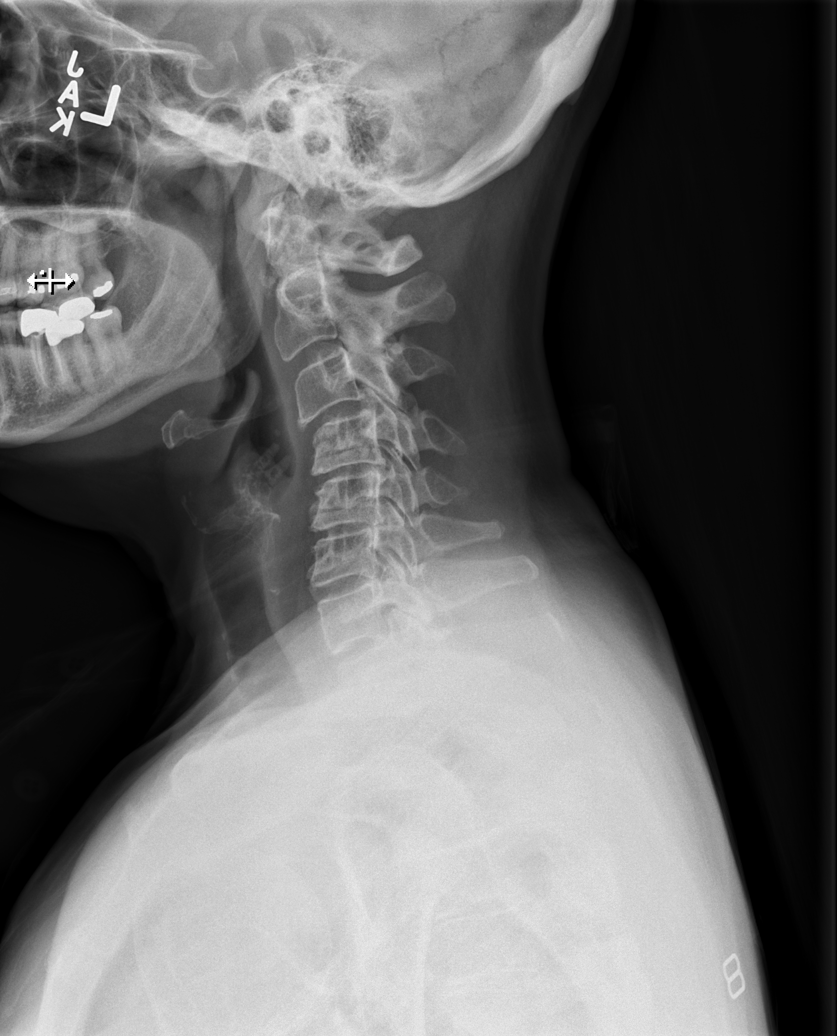

[w cervical spine ap_obl (1 of 2)]
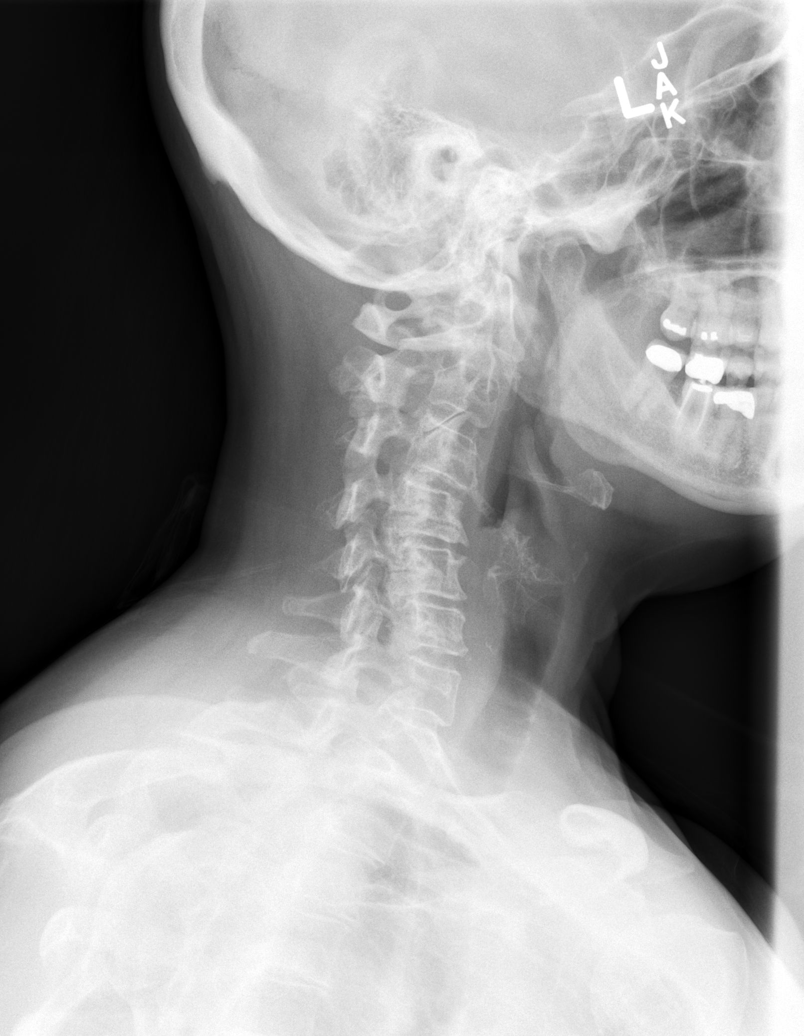

[w cervical spine ap_obl (2 of 2)]
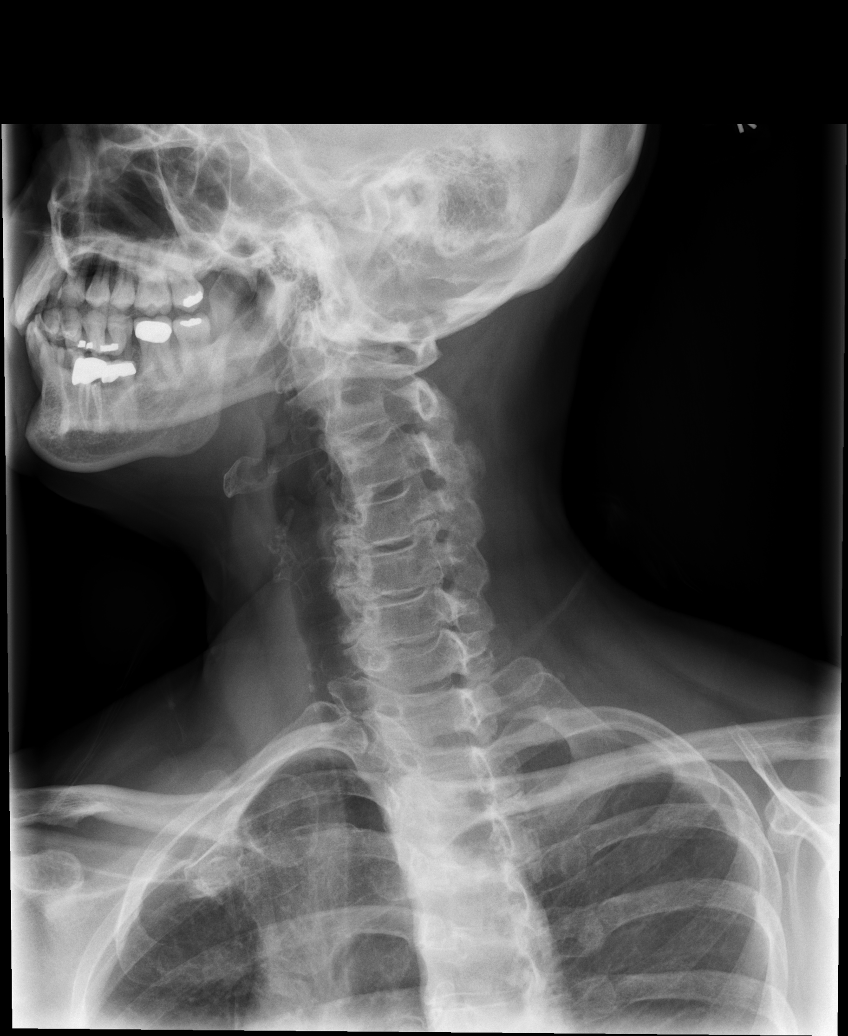

[w cervical spine ap (1 of 2)]
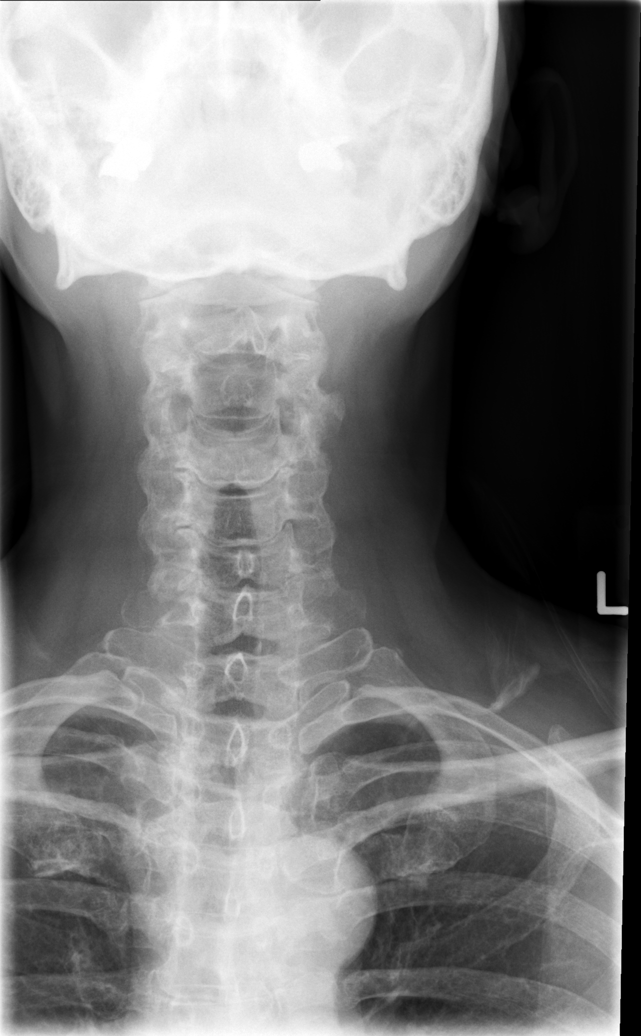

[w cervical spine ap (2 of 2)]
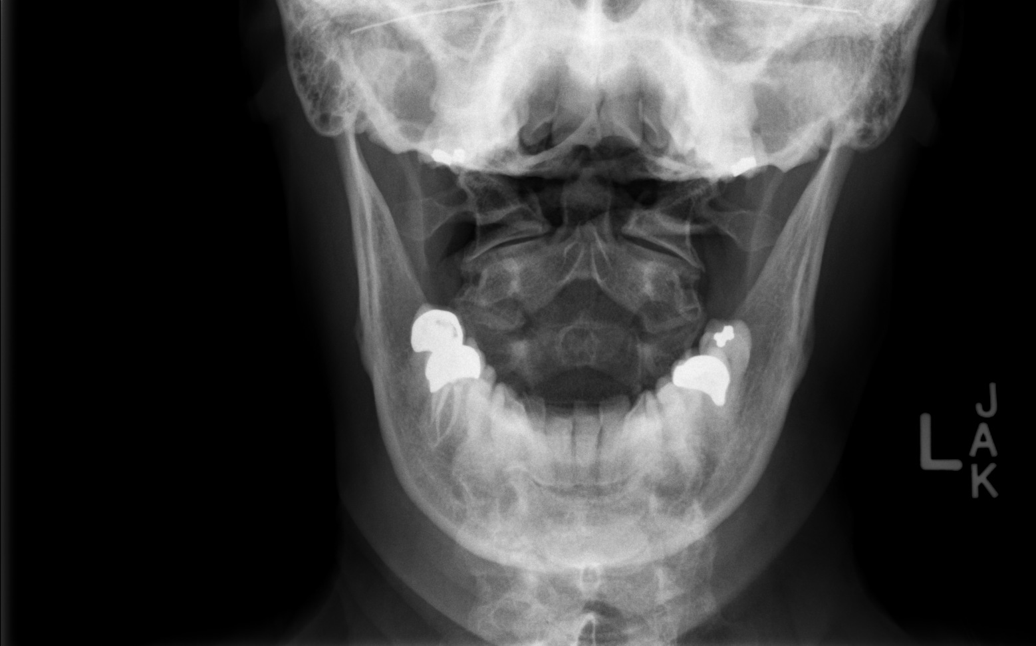

[5 of 5 positions shown; findings below may reference images not displayed]

FINDINGS: There is no acute fracture or subluxation of the cervical spine.
There is reversal of normal cervical lordosis. There is grade 1
C3-C4 anterolisthesis. Mild degenerative changes and bone spurring.
The visualized posterior elements and odontoid appear intact. There
is anatomic alignment of the lateral masses of C1 and C2. Multilevel
bilateral neural foramina narrowing most prominent at C4-C6. The
soft tissues are unremarkable.
IMPRESSION: 1. No acute cervical spine pathology.
2. Multilevel degenerative changes with reversal of normal cervical
lordosis and bilateral neural foramina narrowing most prominent at
C4-C6.

## 2020-10-08 DIAGNOSIS — F0631 Mood disorder due to known physiological condition with depressive features: Secondary | ICD-10-CM | POA: Insufficient documentation

## 2020-12-06 ENCOUNTER — Ambulatory Visit (INDEPENDENT_AMBULATORY_CARE_PROVIDER_SITE_OTHER): Payer: Medicare Other | Admitting: Obstetrics and Gynecology

## 2020-12-06 ENCOUNTER — Encounter: Payer: Self-pay | Admitting: Obstetrics and Gynecology

## 2020-12-06 ENCOUNTER — Other Ambulatory Visit: Payer: Self-pay

## 2020-12-06 VITALS — BP 122/84 | HR 70 | Resp 14 | Ht 62.0 in | Wt 160.0 lb

## 2020-12-06 DIAGNOSIS — Z87440 Personal history of urinary (tract) infections: Secondary | ICD-10-CM

## 2020-12-06 DIAGNOSIS — Z01419 Encounter for gynecological examination (general) (routine) without abnormal findings: Secondary | ICD-10-CM | POA: Diagnosis not present

## 2020-12-06 DIAGNOSIS — Z79890 Hormone replacement therapy (postmenopausal): Secondary | ICD-10-CM

## 2020-12-06 DIAGNOSIS — Z5181 Encounter for therapeutic drug level monitoring: Secondary | ICD-10-CM | POA: Diagnosis not present

## 2020-12-06 DIAGNOSIS — N952 Postmenopausal atrophic vaginitis: Secondary | ICD-10-CM

## 2020-12-06 MED ORDER — ESTRADIOL 0.025 MG/24HR TD PTTW: 1.0000 | MEDICATED_PATCH | TRANSDERMAL | 3 refills | Status: DC

## 2020-12-06 MED ORDER — ESTRADIOL 10 MCG VA TABS: ORAL_TABLET | VAGINAL | 3 refills | Status: DC

## 2020-12-06 MED ORDER — NITROFURANTOIN MONOHYD MACRO 100 MG PO CAPS: 100.0000 mg | ORAL_CAPSULE | ORAL | 4 refills | Status: DC | PRN

## 2020-12-06 NOTE — Progress Notes (Signed)
65 y.o. G52P2002 Married White or Caucasian Not Hispanic or Latino female here for annual exam.    H/O hysterectomy, on low dose ERT patch and vaginal estrogen. She still having night sweats, but overall doing well and wants to continue. She understands the risk. Still has vaginal dryness, helped with a lubricant. No dyspareunia.   Sexually active, uses macrobid prophylaxis secondary to recurrent UTI's.   H/o mild GSI, rare and tolerable. It has gotten a little better in the last year.   Husband with pancreatic cancer, had a whipple procedure, s/p chemo. 2/26 lymph nodes were +. He has had 4 clear scans. He is doing great.     Patient's last menstrual period was 06/25/1993.          Sexually active: Yes.    The current method of family planning is status post hysterectomy.    Exercising: No.  The patient does not participate in regular exercise at present. Smoker:  no  Health Maintenance: Pap:  years ago per patient  History of abnormal Pap:  no MMG:  03-22-20 density B/BIRADS 1 negative  BMD:   09-11-20 osteopenia, T score -1.9  Colonoscopy: 2018 normal, 10 year f/u  TDaP:  06-18-15 Gardasil: n/a   reports that she has never smoked. She has never used smokeless tobacco. She reports current alcohol use of about 1.0 standard drink per week. She reports that she does not use drugs. Homemaker. Husband owns a business where he hires out workers. Daughter has 2 kids (40 and 3 month old girls). Son is married, wife is pregnant with a girl.  Past Medical History:  Diagnosis Date   Anxiety    Carpal tunnel syndrome, right    Complication of anesthesia    hard to wake from general anesthesia   Contact lens/glasses fitting    wears contacts or glasses   Depression    Endometriosis    Fibroids    Headache    MMT (medial meniscus tear)    right knee   Recurrent UTI    Trigger thumb of right hand     Past Surgical History:  Procedure Laterality Date   ABDOMINAL HYSTERECTOMY  06/25/93    secondary to endometriosis   APPENDECTOMY  1995   BUNIONECTOMY Left 04/2011   CARPAL TUNNEL RELEASE Right 07/07/2013   Procedure: RIGHT CARPAL TUNNEL RELEASE;  Surgeon: Cammie Sickle., MD;  Location: [facility];  Service: Orthopedics;  Laterality: Right;   CARPAL TUNNEL RELEASE Left 03/11/2018   Procedure: LEFT CARPAL TUNNEL RELEASE;  Surgeon: Daryll Brod, MD;  Location: [facility];  Service: Orthopedics;  Laterality: Left;   CARPAL TUNNEL RELEASE Right    CESAREAN SECTION     x2   CHONDROPLASTY Right 01/30/2017   Procedure: CHONDROPLASTY;  Surgeon: Ninetta Lights, MD;  Location: [facility];  Service: Orthopedics;  Laterality: Right;   COLONOSCOPY W/ BIOPSIES  06/25/2005   colitis - recheck in 10 years   [place] OF UTERUS  12/92   Hysteroscopy   HYSTEROSCOPY  12/92   KNEE ARTHROSCOPY WITH MEDIAL MENISECTOMY Right 01/30/2017   Procedure: RIGHT KNEE ARTHROSCOPY WITH MEDIAL MENISECTOMY;  Surgeon: Ninetta Lights, MD;  Location: [facility];  Service: Orthopedics;  Laterality: Right;   SHOULDER ARTHROSCOPY Right 10/15/2000   with debridement, DCE and manipulation   SHOULDER SURGERY     2202-2005 bilateral frozen shoulders   TENNIS ELBOW RELEASE/NIRSCHEL PROCEDURE Left 01/10/2016  Procedure: LEFT TENNIS ELBOW RELEASE/DEBRIDEMENT;  Surgeon: Ninetta Lights, MD;  Location: [facility];  Service: Orthopedics;  Laterality: Left;   torn tendon     right elbow    Current Outpatient Medications  Medication Sig Dispense Refill   ALPRAZolam (XANAX) 0.25 MG tablet Take 1 tablet by mouth 2 (two) times daily as needed.   2   Ascorbic Acid (VITAMIN C PO) Take by mouth.     B Complex Vitamins (B COMPLEX PO) Take by mouth.     Biotin 10 MG TABS Take 1 tablet by mouth daily.     buPROPion (WELLBUTRIN XL) 150 MG 24 hr tablet Take 450 mg by mouth daily.      calcium citrate (CALCITRATE - DOSED IN MG ELEMENTAL  CALCIUM) 950 MG tablet Take 200 mg of elemental calcium by mouth daily.     cetirizine (ZYRTEC) 10 MG tablet Take 10 mg by mouth daily.     cholecalciferol (VITAMIN D) 1000 UNITS tablet Take 1,000 Units by mouth daily.     CRANBERRY PO Take by mouth.     Estradiol (VAGIFEM) 10 MCG TABS vaginal tablet USE VAGINALLY TWICE A WEEK 24 tablet 3   estradiol (VIVELLE-DOT) 0.025 MG/24HR Place 1 patch onto the skin 2 (two) times a week. 24 patch 3   MAGNESIUM PO Take by mouth.     nitrofurantoin, macrocrystal-monohydrate, (MACROBID) 100 MG capsule Take 1 capsule (100 mg total) by mouth as needed (after intercourse). 30 capsule 4   Omega-3 Fatty Acids (FISH OIL PO) Take by mouth.     phenazopyridine (PYRIDIUM) 200 MG tablet Take 1 tablet (200 mg total) by mouth 3 (three) times daily as needed for pain. 12 tablet 0   Probiotic Product (PROBIOTIC PO) Take by mouth.     spironolactone (ALDACTONE) 100 MG tablet Take 100 mg by mouth daily.      Vilazodone HCl (VIIBRYD) 40 MG TABS Take 40 mg by mouth daily.      No current facility-administered medications for this visit.    Family History  Problem Relation Age of Onset   Hypertension Mother    Osteoarthritis Mother    Rheum arthritis Mother    Heart failure Father    Hypertension Father    Lung cancer Father 67   COPD Father    Depression Father    Heart disease Father        CABG X 3 in 41 mid 60's   Hypertension Maternal Grandfather    Stroke Maternal Grandfather    Parkinson's disease Maternal Grandmother    Cancer Sister    Depression Sister    Depression Paternal Grandmother    Heart failure Paternal Grandfather    Heart failure Paternal Uncle    Lung cancer Paternal Uncle    Heart failure Paternal Uncle    Breast cancer Neg Hx     Review of Systems  All other systems reviewed and are negative.  Exam:   BP 122/84 (BP Location: Left Arm, Patient Position: Sitting, Cuff Size: Normal)   Pulse 70   Resp 14   Ht 5\' 2"  (1.575 m)    Wt 160 lb (72.6 kg)   LMP 06/25/1993   BMI 29.26 kg/m   Weight change: @WEIGHTCHANGE @ Height:   Height: 5\' 2"  (157.5 cm)  Ht Readings from Last 3 Encounters:  12/06/20 5\' 2"  (1.575 m)  12/05/19 5\' 2"  (1.575 m)  11/22/18 5\' 2"  (1.575 m)    General appearance: alert, cooperative  and appears stated age Head: Normocephalic, without obvious abnormality, atraumatic Neck: no adenopathy, supple, symmetrical, trachea midline and thyroid normal to inspection and palpation Lungs: clear to auscultation bilaterally Cardiovascular: regular rate and rhythm Breasts: normal appearance, no masses or tenderness Abdomen: soft, non-tender; non distended,  no masses,  no organomegaly Extremities: extremities normal, atraumatic, no cyanosis or edema Skin: Skin color, texture, turgor normal. No rashes or lesions Lymph nodes: Cervical, supraclavicular, and axillary nodes normal. No abnormal inguinal nodes palpated Neurologic: Grossly normal   Pelvic: External genitalia:  no lesions              Urethra:  normal appearing urethra with no masses, tenderness or lesions              Bartholins and Skenes: normal                 Vagina: normal appearing vagina with normal color and discharge, no lesions              Cervix: absent               Bimanual Exam:  Uterus:  uterus absent              Adnexa: no mass, fullness, tenderness               Rectovaginal: Confirms               Anus:  normal sphincter tone, no lesions   1. Gynecologic exam normal Discussed breast self exam Discussed calcium and vit D intake Mammogram in 1/23 Colonoscopy UTD Labs with primary  2. Encounter for monitoring postmenopausal estrogen replacement therapy Doing well, wants to continue, aware of risk - estradiol (VIVELLE-DOT) 0.025 MG/24HR; Place 1 patch onto the skin 2 (two) times a week.  Dispense: 24 patch; Refill: 3  3. Vaginal atrophy -controlled with vaginal estrogen - Estradiol (VAGIFEM) 10 MCG TABS vaginal  tablet; USE VAGINALLY TWICE A WEEK  Dispense: 24 tablet; Refill: 3  4. History of recurrent UTIs Takes prophylactic macrobid - nitrofurantoin, macrocrystal-monohydrate, (MACROBID) 100 MG capsule; Take 1 capsule (100 mg total) by mouth as needed (after intercourse).  Dispense: 30 capsule; Refill: 4  In addition to the breast and pelvic exam, ~20 minutes was spent in patent care with her other issues.

## 2020-12-06 NOTE — Patient Instructions (Signed)
Try Uberlube for vaginal lubrication  EXERCISE   We recommended that you start or continue a regular exercise program for good health. Physical activity is anything that gets your body moving, some is better than none. The CDC recommends 150 minutes per week of Moderate-Intensity Aerobic Activity and 2 or more days of Muscle Strengthening Activity.  Benefits of exercise are limitless: helps weight loss/weight maintenance, improves mood and energy, helps with depression and anxiety, improves sleep, tones and strengthens muscles, improves balance, improves bone density, protects from chronic conditions such as heart disease, high blood pressure and diabetes and so much more. To learn more visit: https://www.cdc.gov/physicalactivity/index.html  DIET: Good nutrition starts with a healthy diet of fruits, vegetables, whole grains, and lean protein sources. Drink plenty of water for hydration. Minimize empty calories, sodium, sweets. For more information about dietary recommendations visit: https://health.gov/our-work/nutrition-physical-activity/dietary-guidelines and https://www.myplate.gov/  ALCOHOL:  Women should limit their alcohol intake to no more than 7 drinks/beers/glasses of wine (combined, not each!) per week. Moderation of alcohol intake to this level decreases your risk of breast cancer and liver damage.  If you are concerned that you may have a problem, or your friends have told you they are concerned about your drinking, there are many resources to help. A well-known program that is free, effective, and available to all people all over the nation is Alcoholics Anonymous.  Check out this site to learn more: https://www.aa.org/   CALCIUM AND VITAMIN D:  Adequate intake of calcium and Vitamin D are recommended for bone health.  You should be getting between 1000-1200 mg of calcium and 800 units of Vitamin D daily between diet and supplements  PAP SMEARS:  Pap smears, to check for cervical cancer or  precancers,  have traditionally been done yearly, scientific advances have shown that most women can have pap smears less often.  However, every woman still should have a physical exam from her gynecologist every year. It will include a breast check, inspection of the vulva and vagina to check for abnormal growths or skin changes, a visual exam of the cervix, and then an exam to evaluate the size and shape of the uterus and ovaries. We will also provide age appropriate advice regarding health maintenance, like when you should have certain vaccines, screening for sexually transmitted diseases, bone density testing, colonoscopy, mammograms, etc.   MAMMOGRAMS:  All women over 40 years old should have a routine mammogram.   COLON CANCER SCREENING: Now recommend starting at age 45. At this time colonoscopy is not covered for routine screening until 50. There are take home tests that can be done between 45-49.   COLONOSCOPY:  Colonoscopy to screen for colon cancer is recommended for all women at age 50.  We know, you hate the idea of the prep.  We agree, BUT, having colon cancer and not knowing it is worse!!  Colon cancer so often starts as a polyp that can be seen and removed at colonscopy, which can quite literally save your life!  And if your first colonoscopy is normal and you have no family history of colon cancer, most women don't have to have it again for 10 years.  Once every ten years, you can do something that may end up saving your life, right?  We will be happy to help you get it scheduled when you are ready.  Be sure to check your insurance coverage so you understand how much it will cost.  It may be covered as a preventative service at   at no cost, but you should check your particular policy.      Breast Self-Awareness Breast self-awareness means being familiar with how your breasts look and feel. It involves checking your breasts regularly and reporting any changes to your health care  provider. Practicing breast self-awareness is important. A change in your breasts can be a sign of a serious medical problem. Being familiar with how your breasts look and feel allows you to find any problems early, when treatment is more likely to be successful. All women should practice breast self-awareness, including women who have had breast implants. How to do a breast self-exam One way to learn what is normal for your breasts and whether your breasts are changing is to do a breast self-exam. To do a breast self-exam: Look for Changes  Remove all the clothing above your waist. Stand in front of a mirror in a room with good lighting. Put your hands on your hips. Push your hands firmly downward. Compare your breasts in the mirror. Look for differences between them (asymmetry), such as: Differences in shape. Differences in size. Puckers, dips, and bumps in one breast and not the other. Look at each breast for changes in your skin, such as: Redness. Scaly areas. Look for changes in your nipples, such as: Discharge. Bleeding. Dimpling. Redness. A change in position. Feel for Changes Carefully feel your breasts for lumps and changes. It is best to do this while lying on your back on the floor and again while sitting or standing in the shower or tub with soapy water on your skin. Feel each breast in the following way: Place the arm on the side of the breast you are examining above your head. Feel your breast with the other hand. Start in the nipple area and make  inch (2 cm) overlapping circles to feel your breast. Use the pads of your three middle fingers to do this. Apply light pressure, then medium pressure, then firm pressure. The light pressure will allow you to feel the tissue closest to the skin. The medium pressure will allow you to feel the tissue that is a little deeper. The firm pressure will allow you to feel the tissue close to the ribs. Continue the overlapping circles,  moving downward over the breast until you feel your ribs below your breast. Move one finger-width toward the center of the body. Continue to use the  inch (2 cm) overlapping circles to feel your breast as you move slowly up toward your collarbone. Continue the up and down exam using all three pressures until you reach your armpit.  Write Down What You Find  Write down what is normal for each breast and any changes that you find. Keep a written record with breast changes or normal findings for each breast. By writing this information down, you do not need to depend only on memory for size, tenderness, or location. Write down where you are in your menstrual cycle, if you are still menstruating. If you are having trouble noticing differences in your breasts, do not get discouraged. With time you will become more familiar with the variations in your breasts and more comfortable with the exam. How often should I examine my breasts? Examine your breasts every month. If you are breastfeeding, the best time to examine your breasts is after a feeding or after using a breast pump. If you menstruate, the best time to examine your breasts is 5-7 days after your period is over. During your period, your breasts  are lumpier, and it may be more difficult to notice changes. When should I see my health care provider? See your health care provider if you notice: A change in shape or size of your breasts or nipples. A change in the skin of your breast or nipples, such as a reddened or scaly area. Unusual discharge from your nipples. A lump or thick area that was not there before. Pain in your breasts. Anything that concerns you.

## 2021-09-12 ENCOUNTER — Other Ambulatory Visit: Payer: Self-pay | Admitting: Obstetrics and Gynecology

## 2021-09-12 DIAGNOSIS — N952 Postmenopausal atrophic vaginitis: Secondary | ICD-10-CM

## 2021-09-12 DIAGNOSIS — Z5181 Encounter for therapeutic drug level monitoring: Secondary | ICD-10-CM

## 2021-09-12 NOTE — Telephone Encounter (Signed)
She was sent in a years worth of medication last October. Not due yet. Please ask her to get her mammogram prior to further refills.

## 2021-09-12 NOTE — Telephone Encounter (Signed)
AEX 12/06/2020. Scheduled 12/19/21.  Neg MMR 03/22/2020  Not currently scheduled.

## 2021-12-05 ENCOUNTER — Other Ambulatory Visit: Payer: Self-pay | Admitting: Obstetrics and Gynecology

## 2021-12-05 DIAGNOSIS — Z5181 Encounter for therapeutic drug level monitoring: Secondary | ICD-10-CM

## 2021-12-05 NOTE — Telephone Encounter (Signed)
Last AEX 12/06/2020--scheduled for 12/19/2021 Last mammo report seen from 03/22/2020-neg birads 1 (Will advise pt to schedule or inquire if she has had one somewhere else)

## 2021-12-10 NOTE — Telephone Encounter (Signed)
LDVMOM per DPR.

## 2021-12-10 NOTE — Telephone Encounter (Signed)
Patient called back in voicemail returning call to [facility].  She is on cruise ship and cannot talk on phone or read My Chart but can receive your voice mail message.  She has not had mammogram yet this year. She has moved to the [place] area and planned to do it somewhere there. She said if you need her to do it while she is in [place] you can schedule it and let her know. ([facility]).  Otherwise she said she has OV 12/19/21.

## 2021-12-11 ENCOUNTER — Telehealth: Payer: Self-pay

## 2021-12-11 NOTE — Telephone Encounter (Signed)
Fax refill request received from [facility] [phone] fx [phone] for Estradiol 0.'025mg'$  patch.  Last AEX 12/06/20 rx was given for #24 with 3 refills.  Patient has AEX scheduled 12/19/21.  Pharmacy was notified by phone that patient can get new prescription at her scheduled office visit.

## 2021-12-12 NOTE — Progress Notes (Deleted)
66 y.o. G2P2002 Married White or Caucasian Not Hispanic or Latino female here for annual exam.      Patient's last menstrual period was 06/25/1993.          Sexually active: {yes no:314532}  The current method of family planning is status post hysterectomy.    Exercising: {yes no:314532}  {types:19826} Smoker:  {YES NO:22349}  Health Maintenance: Pap:  years ago per patient  History of abnormal Pap:  no MMG: 03-22-20 density B/BIRADS 1 negative  BMD:   09-11-20 osteopenia, T score -1.9  Colonoscopy: 2018 normal, 10 year f/u  TDaP:  06-18-15 Gardasil: n/a   reports that she has never smoked. She has never used smokeless tobacco. She reports current alcohol use of about 1.0 standard drink of alcohol per week. She reports that she does not use drugs.  Past Medical History:  Diagnosis Date   Anxiety    Carpal tunnel syndrome, right    Complication of anesthesia    hard to wake from general anesthesia   Contact lens/glasses fitting    wears contacts or glasses   Depression    Endometriosis    Fibroids    Headache    MMT (medial meniscus tear)    right knee   Recurrent UTI    Trigger thumb of right hand     Past Surgical History:  Procedure Laterality Date   ABDOMINAL HYSTERECTOMY  06/25/93   secondary to endometriosis   APPENDECTOMY  1995   BUNIONECTOMY Left 04/2011   CARPAL TUNNEL RELEASE Right 07/07/2013   Procedure: RIGHT CARPAL TUNNEL RELEASE;  Surgeon: Cammie Sickle., MD;  Location: [place];  Service: Orthopedics;  Laterality: Right;   CARPAL TUNNEL RELEASE Left 03/11/2018   Procedure: LEFT CARPAL TUNNEL RELEASE;  Surgeon: Daryll Brod, MD;  Location: [place];  Service: Orthopedics;  Laterality: Left;   CARPAL TUNNEL RELEASE Right    CESAREAN SECTION     x2   CHONDROPLASTY Right 01/30/2017   Procedure: CHONDROPLASTY;  Surgeon: Ninetta Lights, MD;  Location: [place];  Service: Orthopedics;  Laterality: Right;    COLONOSCOPY W/ BIOPSIES  06/25/2005   colitis - recheck in 10 years   [place] OF UTERUS  12/92   Hysteroscopy   HYSTEROSCOPY  12/92   KNEE ARTHROSCOPY WITH MEDIAL MENISECTOMY Right 01/30/2017   Procedure: RIGHT KNEE ARTHROSCOPY WITH MEDIAL MENISECTOMY;  Surgeon: Ninetta Lights, MD;  Location: [place];  Service: Orthopedics;  Laterality: Right;   SHOULDER ARTHROSCOPY Right 10/15/2000   with debridement, DCE and manipulation   SHOULDER SURGERY     2202-2005 bilateral frozen shoulders   TENNIS ELBOW RELEASE/NIRSCHEL PROCEDURE Left 01/10/2016   Procedure: LEFT TENNIS ELBOW RELEASE/DEBRIDEMENT;  Surgeon: Ninetta Lights, MD;  Location: [place];  Service: Orthopedics;  Laterality: Left;   torn tendon     right elbow    Current Outpatient Medications  Medication Sig Dispense Refill   ALPRAZolam (XANAX) 0.25 MG tablet Take 1 tablet by mouth 2 (two) times daily as needed.   2   Ascorbic Acid (VITAMIN C PO) Take by mouth.     B Complex Vitamins (B COMPLEX PO) Take by mouth.     Biotin 10 MG TABS Take 1 tablet by mouth daily.     buPROPion (WELLBUTRIN XL) 150 MG 24 hr tablet Take 450 mg by mouth daily.      calcium citrate (CALCITRATE - DOSED IN  MG ELEMENTAL CALCIUM) 950 MG tablet Take 200 mg of elemental calcium by mouth daily.     cetirizine (ZYRTEC) 10 MG tablet Take 10 mg by mouth daily.     cholecalciferol (VITAMIN D) 1000 UNITS tablet Take 1,000 Units by mouth daily.     CRANBERRY PO Take by mouth.     Estradiol (VAGIFEM) 10 MCG TABS vaginal tablet USE VAGINALLY TWICE A WEEK 24 tablet 3   estradiol (VIVELLE-DOT) 0.025 MG/24HR Place 1 patch onto the skin 2 (two) times a week. 24 patch 3   MAGNESIUM PO Take by mouth.     nitrofurantoin, macrocrystal-monohydrate, (MACROBID) 100 MG capsule Take 1 capsule (100 mg total) by mouth as needed (after intercourse). 30 capsule 4   Omega-3 Fatty Acids (FISH OIL PO) Take by mouth.     phenazopyridine  (PYRIDIUM) 200 MG tablet Take 1 tablet (200 mg total) by mouth 3 (three) times daily as needed for pain. 12 tablet 0   Probiotic Product (PROBIOTIC PO) Take by mouth.     spironolactone (ALDACTONE) 100 MG tablet Take 100 mg by mouth daily.      Vilazodone HCl (VIIBRYD) 40 MG TABS Take 40 mg by mouth daily.      No current facility-administered medications for this visit.    Family History  Problem Relation Age of Onset   Hypertension Mother    Osteoarthritis Mother    Rheum arthritis Mother    Heart failure Father    Hypertension Father    Lung cancer Father 45   COPD Father    Depression Father    Heart disease Father        CABG X 3 in 1990 mid 60's   Hypertension Maternal Grandfather    Stroke Maternal Grandfather    Parkinson's disease Maternal Grandmother    Cancer Sister    Depression Sister    Depression Paternal Grandmother    Heart failure Paternal Grandfather    Heart failure Paternal Uncle    Lung cancer Paternal Uncle    Heart failure Paternal Uncle    Breast cancer Neg Hx     Review of Systems  Exam:   LMP 06/25/1993   Weight change: '@WEIGHTCHANGE'$ @ Height:      Ht Readings from Last 3 Encounters:  12/06/20 '5\' 2"'$  (1.575 m)  12/05/19 '5\' 2"'$  (1.575 m)  11/22/18 '5\' 2"'$  (1.575 m)    General appearance: alert, cooperative and appears stated age Head: Normocephalic, without obvious abnormality, atraumatic Neck: no adenopathy, supple, symmetrical, trachea midline and thyroid {CHL AMB PHY EX THYROID NORM DEFAULT:[phone]::"normal to inspection and palpation"} Lungs: clear to auscultation bilaterally Cardiovascular: regular rate and rhythm Breasts: {Exam; breast:13139::"normal appearance, no masses or tenderness"} Abdomen: soft, non-tender; non distended,  no masses,  no organomegaly Extremities: extremities normal, atraumatic, no cyanosis or edema Skin: Skin color, texture, turgor normal. No rashes or lesions Lymph nodes: Cervical, supraclavicular, and axillary  nodes normal. No abnormal inguinal nodes palpated Neurologic: Grossly normal   Pelvic: External genitalia:  no lesions              Urethra:  normal appearing urethra with no masses, tenderness or lesions              Bartholins and Skenes: normal                 Vagina: normal appearing vagina with normal color and discharge, no lesions              Cervix: {  CHL AMB PHY EX CERVIX NORM DEFAULT:[phone]::"no lesions"}               Bimanual Exam:  Uterus:  {CHL AMB PHY EX UTERUS NORM DEFAULT:[phone]::"normal size, contour, position, consistency, mobility, non-tender"}              Adnexa: {CHL AMB PHY EX ADNEXA NO MASS DEFAULT:[phone]::"no mass, fullness, tenderness"}               Rectovaginal: Confirms               Anus:  normal sphincter tone, no lesions  *** chaperoned for the exam.  A:  Well Woman with normal exam  P:

## 2021-12-13 NOTE — Telephone Encounter (Signed)
Pt called stating that she keeps getting the notifications to check her messages but she cant due to being out of the country and was flustered thinking there was something she needed to be doing.   I returned her call letting her know that I tried to find her a spot @ [facility] for her screening mammogram on the day she would be in [place] but they didn't currently have any openings so she and Dr. Talbert Nan could just discuss it when she comes for her appt. She voiced understanding.

## 2021-12-13 NOTE — Telephone Encounter (Signed)
FYI

## 2021-12-19 ENCOUNTER — Telehealth: Payer: Self-pay

## 2021-12-19 ENCOUNTER — Ambulatory Visit: Payer: Medicare Other | Admitting: Obstetrics and Gynecology

## 2021-12-19 NOTE — Telephone Encounter (Signed)
Patient received call yesterday evening from appt desk that her annual visit would not be covered by Medicare. She cancelled it but called today wondering how she will get her medication refills.  I called her back but Chauncey Reading. Had already called her and explained the error in cancelling her and r/s'd her for 12/30/21.  Patient was appreciative of both our phone calls.

## 2021-12-20 NOTE — Telephone Encounter (Signed)
MyChart message reviewed.  Patient was contacted by front office supervisor on 10/26. No additional f/u needed.   Encounter closed.

## 2021-12-23 NOTE — Progress Notes (Deleted)
66 y.o. G2P2002 Married White or Caucasian Not Hispanic or Latino female here for annual exam.      Patient's last menstrual period was 06/25/1993.          Sexually active: {yes no:314532}  The current method of family planning is {contraception:315051}.    Exercising: {yes CB:449675}  {types:19826} Smoker:  {YES NO:22349}  Health Maintenance: Pap:  hx hysterectomy  History of abnormal Pap:  no MMG:  03/22/20 density B Bi-rads 1 neg  BMD:   09-11-20 osteopenia, T score -1.9  Colonoscopy: 2018 normal, 10 year f/u  TDaP:  06-18-15 Gardasil: n/a   reports that she has never smoked. She has never used smokeless tobacco. She reports current alcohol use of about 1.0 standard drink of alcohol per week. She reports that she does not use drugs.  Past Medical History:  Diagnosis Date   Anxiety    Carpal tunnel syndrome, right    Complication of anesthesia    hard to wake from general anesthesia   Contact lens/glasses fitting    wears contacts or glasses   Depression    Endometriosis    Fibroids    Headache    MMT (medial meniscus tear)    right knee   Recurrent UTI    Trigger thumb of right hand     Past Surgical History:  Procedure Laterality Date   ABDOMINAL HYSTERECTOMY  06/25/93   secondary to endometriosis   APPENDECTOMY  1995   BUNIONECTOMY Left 04/2011   CARPAL TUNNEL RELEASE Right 07/07/2013   Procedure: RIGHT CARPAL TUNNEL RELEASE;  Surgeon: Cammie Sickle., MD;  Location: [place];  Service: Orthopedics;  Laterality: Right;   CARPAL TUNNEL RELEASE Left 03/11/2018   Procedure: LEFT CARPAL TUNNEL RELEASE;  Surgeon: Daryll Brod, MD;  Location: [place];  Service: Orthopedics;  Laterality: Left;   CARPAL TUNNEL RELEASE Right    CESAREAN SECTION     x2   CHONDROPLASTY Right 01/30/2017   Procedure: CHONDROPLASTY;  Surgeon: Ninetta Lights, MD;  Location: [place];  Service: Orthopedics;  Laterality: Right;   COLONOSCOPY W/  BIOPSIES  06/25/2005   colitis - recheck in 10 years   [place] OF UTERUS  12/92   Hysteroscopy   HYSTEROSCOPY  12/92   KNEE ARTHROSCOPY WITH MEDIAL MENISECTOMY Right 01/30/2017   Procedure: RIGHT KNEE ARTHROSCOPY WITH MEDIAL MENISECTOMY;  Surgeon: Ninetta Lights, MD;  Location: [place];  Service: Orthopedics;  Laterality: Right;   SHOULDER ARTHROSCOPY Right 10/15/2000   with debridement, DCE and manipulation   SHOULDER SURGERY     2202-2005 bilateral frozen shoulders   TENNIS ELBOW RELEASE/NIRSCHEL PROCEDURE Left 01/10/2016   Procedure: LEFT TENNIS ELBOW RELEASE/DEBRIDEMENT;  Surgeon: Ninetta Lights, MD;  Location: [place];  Service: Orthopedics;  Laterality: Left;   torn tendon     right elbow    Current Outpatient Medications  Medication Sig Dispense Refill   ALPRAZolam (XANAX) 0.25 MG tablet Take 1 tablet by mouth 2 (two) times daily as needed.   2   Ascorbic Acid (VITAMIN C PO) Take by mouth.     B Complex Vitamins (B COMPLEX PO) Take by mouth.     Biotin 10 MG TABS Take 1 tablet by mouth daily.     buPROPion (WELLBUTRIN XL) 150 MG 24 hr tablet Take 450 mg by mouth daily.      calcium citrate (CALCITRATE - DOSED IN MG ELEMENTAL  CALCIUM) 950 MG tablet Take 200 mg of elemental calcium by mouth daily.     cetirizine (ZYRTEC) 10 MG tablet Take 10 mg by mouth daily.     cholecalciferol (VITAMIN D) 1000 UNITS tablet Take 1,000 Units by mouth daily.     CRANBERRY PO Take by mouth.     Estradiol (VAGIFEM) 10 MCG TABS vaginal tablet USE VAGINALLY TWICE A WEEK 24 tablet 3   estradiol (VIVELLE-DOT) 0.025 MG/24HR PLACE ONE PATCH ONTO THE SKIN TWO TIMES A WEEK 24 patch 0   MAGNESIUM PO Take by mouth.     nitrofurantoin, macrocrystal-monohydrate, (MACROBID) 100 MG capsule Take 1 capsule (100 mg total) by mouth as needed (after intercourse). 30 capsule 4   Omega-3 Fatty Acids (FISH OIL PO) Take by mouth.     phenazopyridine (PYRIDIUM) 200 MG  tablet Take 1 tablet (200 mg total) by mouth 3 (three) times daily as needed for pain. 12 tablet 0   Probiotic Product (PROBIOTIC PO) Take by mouth.     spironolactone (ALDACTONE) 100 MG tablet Take 100 mg by mouth daily.      Vilazodone HCl (VIIBRYD) 40 MG TABS Take 40 mg by mouth daily.      No current facility-administered medications for this visit.    Family History  Problem Relation Age of Onset   Hypertension Mother    Osteoarthritis Mother    Rheum arthritis Mother    Heart failure Father    Hypertension Father    Lung cancer Father 70   COPD Father    Depression Father    Heart disease Father        CABG X 3 in 1990 mid 60's   Hypertension Maternal Grandfather    Stroke Maternal Grandfather    Parkinson's disease Maternal Grandmother    Cancer Sister    Depression Sister    Depression Paternal Grandmother    Heart failure Paternal Grandfather    Heart failure Paternal Uncle    Lung cancer Paternal Uncle    Heart failure Paternal Uncle    Breast cancer Neg Hx     Review of Systems  Exam:   LMP 06/25/1993   Weight change: '@WEIGHTCHANGE'$ @ Height:      Ht Readings from Last 3 Encounters:  12/06/20 '5\' 2"'$  (1.575 m)  12/05/19 '5\' 2"'$  (1.575 m)  11/22/18 '5\' 2"'$  (1.575 m)    General appearance: alert, cooperative and appears stated age Head: Normocephalic, without obvious abnormality, atraumatic Neck: no adenopathy, supple, symmetrical, trachea midline and thyroid {CHL AMB PHY EX THYROID NORM DEFAULT:[phone]::"normal to inspection and palpation"} Lungs: clear to auscultation bilaterally Cardiovascular: regular rate and rhythm Breasts: {Exam; breast:13139::"normal appearance, no masses or tenderness"} Abdomen: soft, non-tender; non distended,  no masses,  no organomegaly Extremities: extremities normal, atraumatic, no cyanosis or edema Skin: Skin color, texture, turgor normal. No rashes or lesions Lymph nodes: Cervical, supraclavicular, and axillary nodes normal. No  abnormal inguinal nodes palpated Neurologic: Grossly normal   Pelvic: External genitalia:  no lesions              Urethra:  normal appearing urethra with no masses, tenderness or lesions              Bartholins and Skenes: normal                 Vagina: normal appearing vagina with normal color and discharge, no lesions              Cervix: {CHL AMB PHY  EX CERVIX NORM [phone]::"no lesions"}               Bimanual Exam:  Uterus:  {CHL AMB PHY EX UTERUS NORM DEFAULT:[phone]::"normal size, contour, position, consistency, mobility, non-tender"}              Adnexa: {CHL AMB PHY EX ADNEXA NO MASS [phone]::"no mass, fullness, tenderness"}               Rectovaginal: Confirms               Anus:  normal sphincter tone, no lesions  *** chaperoned for the exam.  A:  Well Woman with normal exam  P:

## 2021-12-30 ENCOUNTER — Ambulatory Visit: Payer: Medicare Other | Admitting: Obstetrics and Gynecology

## 2022-01-28 ENCOUNTER — Encounter: Payer: Self-pay | Admitting: Obstetrics and Gynecology

## 2022-01-28 ENCOUNTER — Ambulatory Visit (INDEPENDENT_AMBULATORY_CARE_PROVIDER_SITE_OTHER): Payer: Medicare Other | Admitting: Obstetrics and Gynecology

## 2022-01-28 DIAGNOSIS — Z5181 Encounter for therapeutic drug level monitoring: Secondary | ICD-10-CM | POA: Diagnosis not present

## 2022-01-28 DIAGNOSIS — Z87440 Personal history of urinary (tract) infections: Secondary | ICD-10-CM

## 2022-01-28 DIAGNOSIS — Z79890 Hormone replacement therapy (postmenopausal): Secondary | ICD-10-CM

## 2022-01-28 DIAGNOSIS — N952 Postmenopausal atrophic vaginitis: Secondary | ICD-10-CM

## 2022-01-28 MED ORDER — ESTRADIOL 10 MCG VA TABS: ORAL_TABLET | VAGINAL | 3 refills | Status: DC

## 2022-01-28 MED ORDER — ESTRADIOL 0.025 MG/24HR TD PTTW: MEDICATED_PATCH | TRANSDERMAL | 3 refills | Status: DC

## 2022-01-28 MED ORDER — NITROFURANTOIN MONOHYD MACRO 100 MG PO CAPS: 100.0000 mg | ORAL_CAPSULE | ORAL | 4 refills | Status: DC | PRN

## 2022-01-28 MED ORDER — PHENAZOPYRIDINE HCL 200 MG PO TABS: ORAL_TABLET | ORAL | 0 refills | Status: DC

## 2022-01-28 NOTE — Progress Notes (Signed)
GYNECOLOGY  VISIT   HPI: 66 y.o.   Married White or Caucasian Not Hispanic or Latino  female   [id number] with Patient's last menstrual period was 06/25/1993.   here for medication visit for Estradiol and vivelle dot, pyridium, and nitrofurantion.   H/O hysterectomy, she is on a low dose ERT patch and vaginal estrogen. Still having vasomotor symptoms, tolerable.   She is sexually active and uses macrobid prophylaxis secondary to recurrent UTI's. She needs pyridium in case she gets a UTI when out of the country.   Husband with pancreatic cancer, had a whipple procedure, s/p chemo. 2/26 lymph nodes were +. He is 2 years cancer free, doing great.   Last mammogram was on 03/22/20 was normal, scheduled for tomorrow in [place].   Colonoscopy is UTD, last done in 2018, normal.  She moved to [facility] in the last year (2 hours from here), to be closer to her kids. Missing her friends from [place]. Son has 1 daughter, daughter has 2 daughters.   GYNECOLOGIC HISTORY: Patient's last menstrual period was 06/25/1993. Contraception:pmp  Menopausal hormone therapy: estradiol        OB History     Gravida  2   Para  2   Term  2   Preterm      AB      Living  2      SAB      IAB      Ectopic      Multiple      Live Births                 Patient Active Problem List   Diagnosis Date Noted   Depression due to physical illness 10/08/2020   Trigger finger of left thumb 04/06/2019   Lateral epicondylitis of right elbow 02/04/2019   Contracture of palmar fascia 11/17/2018   Carpal tunnel syndrome of left wrist 11/17/2018    Past Medical History:  Diagnosis Date   Anxiety    Carpal tunnel syndrome, right    Complication of anesthesia    hard to wake from general anesthesia   Contact lens/glasses fitting    wears contacts or glasses   Depression    Endometriosis    Fibroids    Headache    MMT (medial meniscus tear)    right knee   Recurrent UTI    Trigger thumb of  right hand     Past Surgical History:  Procedure Laterality Date   ABDOMINAL HYSTERECTOMY  06/25/93   secondary to endometriosis   APPENDECTOMY  1995   BUNIONECTOMY Left 04/2011   CARPAL TUNNEL RELEASE Right 07/07/2013   Procedure: RIGHT CARPAL TUNNEL RELEASE;  Surgeon: Cammie Sickle., MD;  Location: [place];  Service: Orthopedics;  Laterality: Right;   CARPAL TUNNEL RELEASE Left 03/11/2018   Procedure: LEFT CARPAL TUNNEL RELEASE;  Surgeon: Daryll Brod, MD;  Location: [place];  Service: Orthopedics;  Laterality: Left;   CARPAL TUNNEL RELEASE Right    CESAREAN SECTION     x2   CHONDROPLASTY Right 01/30/2017   Procedure: CHONDROPLASTY;  Surgeon: Ninetta Lights, MD;  Location: [place];  Service: Orthopedics;  Laterality: Right;   COLONOSCOPY W/ BIOPSIES  06/25/2005   colitis - recheck in 10 years   [place]  12/92   Hysteroscopy   HYSTEROSCOPY  12/92   KNEE ARTHROSCOPY WITH MEDIAL MENISECTOMY Right 01/30/2017   Procedure: RIGHT KNEE ARTHROSCOPY  WITH MEDIAL MENISECTOMY;  Surgeon: Ninetta Lights, MD;  Location: [place];  Service: Orthopedics;  Laterality: Right;   SHOULDER ARTHROSCOPY Right 10/15/2000   with debridement, DCE and manipulation   SHOULDER SURGERY     2202-2005 bilateral frozen shoulders   TENNIS ELBOW RELEASE/NIRSCHEL PROCEDURE Left 01/10/2016   Procedure: LEFT TENNIS ELBOW RELEASE/DEBRIDEMENT;  Surgeon: Ninetta Lights, MD;  Location: [place];  Service: Orthopedics;  Laterality: Left;   torn tendon     right elbow    Current Outpatient Medications  Medication Sig Dispense Refill   ALPRAZolam (XANAX) 0.25 MG tablet Take 1 tablet by mouth 2 (two) times daily as needed.   2   Ascorbic Acid (VITAMIN C PO) Take by mouth.     B Complex Vitamins (B COMPLEX PO) Take by mouth.     Biotin 10 MG TABS Take 1 tablet by mouth daily.     buPROPion (WELLBUTRIN XL) 150 MG  24 hr tablet Take 450 mg by mouth daily.      calcium citrate (CALCITRATE - DOSED IN MG ELEMENTAL CALCIUM) 950 MG tablet Take 200 mg of elemental calcium by mouth daily.     cetirizine (ZYRTEC) 10 MG tablet Take 10 mg by mouth daily.     cholecalciferol (VITAMIN D) 1000 UNITS tablet Take 1,000 Units by mouth daily.     CRANBERRY PO Take by mouth.     Estradiol (VAGIFEM) 10 MCG TABS vaginal tablet USE VAGINALLY TWICE A WEEK 24 tablet 3   estradiol (VIVELLE-DOT) 0.025 MG/24HR PLACE ONE PATCH ONTO THE SKIN TWO TIMES A WEEK 24 patch 0   MAGNESIUM PO Take by mouth.     nitrofurantoin, macrocrystal-monohydrate, (MACROBID) 100 MG capsule Take 1 capsule (100 mg total) by mouth as needed (after intercourse). 30 capsule 4   Omega-3 Fatty Acids (FISH OIL PO) Take by mouth.     phenazopyridine (PYRIDIUM) 200 MG tablet Take 1 tablet (200 mg total) by mouth 3 (three) times daily as needed for pain. 12 tablet 0   Probiotic Product (PROBIOTIC PO) Take by mouth.     Vilazodone HCl (VIIBRYD) 40 MG TABS Take 40 mg by mouth daily.      No current facility-administered medications for this visit.     ALLERGIES: Ampicillin, Cephalexin, Ciprofloxacin, Levaquin [levofloxacin in d5w], Penicillins, Septra [sulfamethoxazole-trimethoprim], and Zithromax [azithromycin]  Family History  Problem Relation Age of Onset   Hypertension Mother    Osteoarthritis Mother    Rheum arthritis Mother    Heart failure Father    Hypertension Father    Lung cancer Father 77   COPD Father    Depression Father    Heart disease Father        CABG X 3 in 1990 mid 60's   Hypertension Maternal Grandfather    Stroke Maternal Grandfather    Parkinson's disease Maternal Grandmother    Cancer Sister    Depression Sister    Depression Paternal Grandmother    Heart failure Paternal Grandfather    Heart failure Paternal Uncle    Lung cancer Paternal Uncle    Heart failure Paternal Uncle    Breast cancer Neg Hx     Social History    Socioeconomic History   Marital status: Married    Spouse name: Not on file   Number of children: Not on file   Years of education: Not on file   Highest education level: Not on file  Occupational History  Not on file  Tobacco Use   Smoking status: Never   Smokeless tobacco: Never  Vaping Use   Vaping Use: Never used  Substance and Sexual Activity   Alcohol use: Yes    Alcohol/week: 1.0 standard drink of alcohol    Types: 1 Glasses of wine per week   Drug use: No   Sexual activity: Yes    Partners: Male    Birth control/protection: Surgical  Other Topics Concern   Not on file  Social History Narrative   Not on file   Social Determinants of Health   Financial Resource Strain: Not on file  Food Insecurity: Not on file  Transportation Needs: Not on file  Physical Activity: Not on file  Stress: Not on file  Social Connections: Not on file  Intimate Partner Violence: Not on file    Review of Systems  All other systems reviewed and are negative.   PHYSICAL EXAMINATION:    LMP 06/25/1993     General appearance: alert, cooperative and appears stated age  35. Encounter for monitoring postmenopausal estrogen replacement therapy Doing well, aware of the risks, wants to continue Mammogram tomorrow - estradiol (VIVELLE-DOT) 0.025 MG/24HR; PLACE ONE PATCH ONTO THE SKIN TWO TIMES A WEEK  Dispense: 24 patch; Refill: 3  2. Vaginal atrophy Doing well with vagifem, given samples of uberlube - Estradiol (VAGIFEM) 10 MCG TABS vaginal tablet; USE VAGINALLY TWICE A WEEK  Dispense: 24 tablet; Refill: 3  3. History of recurrent UTIs - phenazopyridine (PYRIDIUM) 200 MG tablet; Take one tablet TID x 2 days as needed  Dispense: 12 tablet; Refill: 0 - nitrofurantoin, macrocrystal-monohydrate, (MACROBID) 100 MG capsule; Take 1 capsule (100 mg total) by mouth as needed (after intercourse).  Dispense: 30 capsule; Refill: 4

## 2022-02-10 ENCOUNTER — Encounter: Payer: Self-pay | Admitting: Obstetrics and Gynecology

## 2022-12-10 ENCOUNTER — Ambulatory Visit: Payer: Medicare Other | Admitting: Obstetrics and Gynecology

## 2023-02-03 ENCOUNTER — Ambulatory Visit (INDEPENDENT_AMBULATORY_CARE_PROVIDER_SITE_OTHER): Payer: Medicare Other | Admitting: Obstetrics and Gynecology

## 2023-02-03 ENCOUNTER — Ambulatory Visit: Payer: Medicare Other | Admitting: Obstetrics and Gynecology

## 2023-02-03 ENCOUNTER — Encounter: Payer: Self-pay | Admitting: Obstetrics and Gynecology

## 2023-02-03 VITALS — BP 122/76 | HR 68 | Wt 163.0 lb

## 2023-02-03 DIAGNOSIS — Z79890 Hormone replacement therapy (postmenopausal): Secondary | ICD-10-CM | POA: Diagnosis not present

## 2023-02-03 DIAGNOSIS — N952 Postmenopausal atrophic vaginitis: Secondary | ICD-10-CM | POA: Diagnosis not present

## 2023-02-03 DIAGNOSIS — Z5181 Encounter for therapeutic drug level monitoring: Secondary | ICD-10-CM

## 2023-02-03 DIAGNOSIS — N958 Other specified menopausal and perimenopausal disorders: Secondary | ICD-10-CM | POA: Insufficient documentation

## 2023-02-03 MED ORDER — ESTRADIOL 0.025 MG/24HR TD PTTW: MEDICATED_PATCH | TRANSDERMAL | 3 refills | Status: DC

## 2023-02-03 MED ORDER — ESTRADIOL 0.1 MG/GM VA CREA: TOPICAL_CREAM | VAGINAL | 1 refills | Status: AC

## 2023-02-03 NOTE — Progress Notes (Signed)
67 y.o. [id number] female s/p hysterectomy on HRT here for vaginal concern.  Pt c/o left side of labia irritation and red, usually worse after intercourse x 6 months. States she used A&D ointment. This comes and goes but has happened several times. No sx today or for last 3 weeks. Has had significant dryness since hysterectomy.  Patient's last menstrual period was 06/25/1993.    GYN HISTORY: S/p hysterectomy  OB History  Gravida Para Term Preterm AB Living  2 2 2     2   SAB IAB Ectopic Multiple Live Births               # Outcome Date GA Lbr Len/2nd Weight Sex Type Anes PTL Lv  2 Term      CS-Classical     1 Term      CS-Classical       Past Medical History:  Diagnosis Date   Anxiety    Carpal tunnel syndrome, right    Complication of anesthesia    hard to wake from general anesthesia   Contact lens/glasses fitting    wears contacts or glasses   Depression    Endometriosis    Fibroids    Headache    MMT (medial meniscus tear)    right knee   Recurrent UTI    Trigger thumb of right hand     Past Surgical History:  Procedure Laterality Date   ABDOMINAL HYSTERECTOMY  06/25/93   secondary to endometriosis   APPENDECTOMY  1995   BUNIONECTOMY Left 04/2011   CARPAL TUNNEL RELEASE Right 07/07/2013   Procedure: RIGHT CARPAL TUNNEL RELEASE;  Surgeon: Wyn Forster., MD;  Location: [facility];  Service: Orthopedics;  Laterality: Right;   CARPAL TUNNEL RELEASE Left 03/11/2018   Procedure: LEFT CARPAL TUNNEL RELEASE;  Surgeon: Cindee Salt, MD;  Location: [facility];  Service: Orthopedics;  Laterality: Left;   CARPAL TUNNEL RELEASE Right    CESAREAN SECTION     x2   CHONDROPLASTY Right 01/30/2017   Procedure: CHONDROPLASTY;  Surgeon: Loreta Ave, MD;  Location: [facility];  Service: Orthopedics;  Laterality: Right;   COLONOSCOPY W/ BIOPSIES  06/25/2005   colitis - recheck in 10 years   DILATION AND CURETTAGE OF UTERUS  12/92    Hysteroscopy   HYSTEROSCOPY  12/92   KNEE ARTHROSCOPY WITH MEDIAL MENISECTOMY Right 01/30/2017   Procedure: RIGHT KNEE ARTHROSCOPY WITH MEDIAL MENISECTOMY;  Surgeon: Loreta Ave, MD;  Location: [facility];  Service: Orthopedics;  Laterality: Right;   SHOULDER ARTHROSCOPY Right 10/15/2000   with debridement, DCE and manipulation   SHOULDER SURGERY     2202-2005 bilateral frozen shoulders   TENNIS ELBOW RELEASE/NIRSCHEL PROCEDURE Left 01/10/2016   Procedure: LEFT TENNIS ELBOW RELEASE/DEBRIDEMENT;  Surgeon: Loreta Ave, MD;  Location: [facility];  Service: Orthopedics;  Laterality: Left;   torn tendon     right elbow    Current Outpatient Medications on File Prior to Visit  Medication Sig Dispense Refill   ALPRAZolam (XANAX) 0.25 MG tablet Take 1 tablet by mouth 2 (two) times daily as needed.   2   Ascorbic Acid (VITAMIN C PO) Take by mouth.     B Complex Vitamins (B COMPLEX PO) Take by mouth.     Biotin 10 MG TABS Take 1 tablet by mouth daily.     buPROPion (WELLBUTRIN XL) 150 MG 24 hr tablet Take 450 mg by  mouth daily.      calcium citrate (CALCITRATE - DOSED IN MG ELEMENTAL CALCIUM) 950 MG tablet Take 200 mg of elemental calcium by mouth daily.     cetirizine (ZYRTEC) 10 MG tablet Take 10 mg by mouth daily.     CRANBERRY PO Take by mouth.     MAGNESIUM PO Take by mouth.     nitrofurantoin, macrocrystal-monohydrate, (MACROBID) 100 MG capsule Take 1 capsule (100 mg total) by mouth as needed (after intercourse). 30 capsule 4   Omega-3 Fatty Acids (FISH OIL PO) Take by mouth.     phenazopyridine (PYRIDIUM) 200 MG tablet Take one tablet TID x 2 days as needed 12 tablet 0   Probiotic Product (PROBIOTIC PO) Take by mouth.     Vilazodone HCl (VIIBRYD) 40 MG TABS Take 40 mg by mouth daily.      cholecalciferol (VITAMIN D) 1000 UNITS tablet Take 1,000 Units by mouth daily. (Patient not taking: Reported on 02/03/2023)     No current facility-administered  medications on file prior to visit.    Allergies  Allergen Reactions   Ampicillin Rash   Cephalexin Rash   Ciprofloxacin Rash   Levaquin [Levofloxacin In D5w] Rash    Rash around chest and neck and under breast   Penicillins Rash   Septra [Sulfamethoxazole-Trimethoprim] Rash   Zithromax [Azithromycin] Rash      PE Today's Vitals   02/03/23 1451  BP: 122/76  Pulse: 68  SpO2: 99%  Weight: 163 lb (73.9 kg)   Body mass index is 29.81 kg/m.  Physical Exam Vitals reviewed. Exam conducted with a chaperone present.  Constitutional:      General: She is not in acute distress.    Appearance: Normal appearance.  HENT:     Head: Normocephalic and atraumatic.     Nose: Nose normal.  Eyes:     Extraocular Movements: Extraocular movements intact.     Conjunctiva/sclera: Conjunctivae normal.  Pulmonary:     Effort: Pulmonary effort is normal.  Genitourinary:    Exam position: Lithotomy position.     Vagina: Normal. No vaginal discharge.     Uterus: Absent.      Adnexa: Right adnexa normal and left adnexa normal.     Comments: Vulvar atrophy and dryness noted Cervix and uterus absent Musculoskeletal:        General: Normal range of motion.     Cervical back: Normal range of motion.  Neurological:     General: No focal deficit present.     Mental Status: She is alert.  Psychiatric:        Mood and Affect: Mood normal.        Behavior: Behavior normal.       Assessment and Plan:        Genitourinary syndrome of menopause -     Estradiol; Apply 1/2 gram to vulva nightly for 2 weeks then decrease to 1/2 gram to vulva two nights a week.  Dispense: 42.5 g; Refill: 1  Encounter for monitoring postmenopausal estrogen replacement therapy -     Estradiol; PLACE ONE PATCH ONTO THE SKIN TWO TIMES A WEEK  Dispense: 24 patch; Refill: 3  Normal exam today.  Recommend estrace cream applied to vulva and daily moisturizer. Ah! Yes sample provided. All questions answered.  Rosalyn Gess, MD

## 2023-02-03 NOTE — Patient Instructions (Signed)
Considering applying coconut oil to the vulva after showers. You could also using daily vaginal moisturizers by brands like Good Clean Love and Ah! Yes.

## 2023-02-10 ENCOUNTER — Encounter: Payer: Self-pay | Admitting: Obstetrics and Gynecology

## 2023-02-13 ENCOUNTER — Other Ambulatory Visit: Payer: Self-pay | Admitting: Obstetrics and Gynecology

## 2023-02-13 DIAGNOSIS — Z87440 Personal history of urinary (tract) infections: Secondary | ICD-10-CM

## 2023-02-13 MED ORDER — NITROFURANTOIN MONOHYD MACRO 100 MG PO CAPS: 100.0000 mg | ORAL_CAPSULE | ORAL | 1 refills | Status: AC | PRN

## 2023-02-13 MED ORDER — PHENAZOPYRIDINE HCL 200 MG PO TABS: ORAL_TABLET | ORAL | 1 refills | Status: AC

## 2023-12-31 ENCOUNTER — Other Ambulatory Visit: Payer: Self-pay | Admitting: Obstetrics and Gynecology

## 2023-12-31 DIAGNOSIS — Z5181 Encounter for therapeutic drug level monitoring: Secondary | ICD-10-CM

## 2023-12-31 NOTE — Telephone Encounter (Signed)
 Med refill request: vivelle   Last AEX: 12/06/20 with [facility]  Next AEX: not scheduled  Last MMG (if hormonal med) Refill authorized: last rx 03/05/22 #24 with 3 refills.  rx refused, pt needs appt. Attempt has been made to contact pt.

## 2024-01-02 ENCOUNTER — Other Ambulatory Visit: Payer: Self-pay | Admitting: Obstetrics and Gynecology

## 2024-01-02 DIAGNOSIS — Z5181 Encounter for therapeutic drug level monitoring: Secondary | ICD-10-CM

## 2024-01-04 NOTE — Telephone Encounter (Signed)
 Refill request on Vivelle  dot.  Encounter on 12/31/23, you gave the ok to send in a one month supply.   I sent that into the pharmacy and sent a message upfront to have them schedule an appt with the pt

## 2024-03-24 ENCOUNTER — Other Ambulatory Visit: Payer: Self-pay | Admitting: Obstetrics and Gynecology

## 2024-03-24 DIAGNOSIS — Z5181 Encounter for therapeutic drug level monitoring: Secondary | ICD-10-CM

## 2024-03-24 NOTE — Telephone Encounter (Deleted)
 Spoke with patient and she notified me she is in [place] and her doctor provides the refills. She is no longer in our practice

## 2024-03-24 NOTE — Telephone Encounter (Signed)
 Thank you :)

## 2024-03-24 NOTE — Telephone Encounter (Signed)
 Med refill request:    estradiol  (VIVELLE -DOT) 0.025 MG/24HR  Start:  01/04/24 Disp: 24 patches Refills:  0  Last OV:  02/03/23 Next AEX:  Not yet scheduled *Front desk has been asked to contact Pt to schedule an annual visit.  Last MMG (if hormonal med):  01/2023 Refill authorized? Please Advise.

## 2024-03-24 NOTE — Telephone Encounter (Signed)
 Needs appt

## 2024-03-24 NOTE — Telephone Encounter (Signed)
 Spoke with patient and she notified me she is in [place] and her doctor provides the refills. She is no longer in our practice

## 2024-03-27 ENCOUNTER — Other Ambulatory Visit: Payer: Self-pay | Admitting: Obstetrics and Gynecology

## 2024-03-27 DIAGNOSIS — Z5181 Encounter for therapeutic drug level monitoring: Secondary | ICD-10-CM

## 2024-03-29 NOTE — Telephone Encounter (Signed)
 Transferred care

## 2024-03-29 NOTE — Telephone Encounter (Signed)
 Med refill request:   estradiol  (VIVELLE -DOT) 0.025 MG/24HR  Start:  01/04/24 Disp: 24 patches Refills:  0  Last AEX:  01/28/22 Next AEX:  Not yet scheduled Last MMG (if hormonal med):  03/22/20 Refill authorized? Please Advise.
